# Patient Record
Sex: Female | Born: 1960 | Race: Black or African American | Hispanic: No | Smoking: Never smoker
Health system: Southern US, Community
[De-identification: ages and names within clinical notes are randomized; demographics above are authoritative.]

## PROBLEM LIST (undated history)

## (undated) DIAGNOSIS — M1990 Unspecified osteoarthritis, unspecified site: Secondary | ICD-10-CM

## (undated) DIAGNOSIS — I10 Essential (primary) hypertension: Secondary | ICD-10-CM

## (undated) DIAGNOSIS — R079 Chest pain, unspecified: Secondary | ICD-10-CM

## (undated) DIAGNOSIS — G43909 Migraine, unspecified, not intractable, without status migrainosus: Secondary | ICD-10-CM

## (undated) HISTORY — DX: Chest pain, unspecified: R07.9

## (undated) HISTORY — PX: KNEE SURGERY: SHX244

## (undated) HISTORY — PX: HAND TENDON SURGERY: SHX663

---

## 2002-02-02 ENCOUNTER — Emergency Department (HOSPITAL_COMMUNITY): Admission: EM | Admit: 2002-02-02 | Discharge: 2002-02-03 | Payer: Self-pay | Admitting: Emergency Medicine

## 2006-08-20 ENCOUNTER — Emergency Department (HOSPITAL_COMMUNITY): Admission: EM | Admit: 2006-08-20 | Discharge: 2006-08-20 | Payer: Self-pay | Admitting: Family Medicine

## 2006-09-08 ENCOUNTER — Emergency Department (HOSPITAL_COMMUNITY): Admission: EM | Admit: 2006-09-08 | Discharge: 2006-09-08 | Payer: Self-pay | Admitting: Emergency Medicine

## 2007-03-15 ENCOUNTER — Emergency Department (HOSPITAL_COMMUNITY): Admission: EM | Admit: 2007-03-15 | Discharge: 2007-03-15 | Payer: Self-pay | Admitting: Family Medicine

## 2008-08-17 ENCOUNTER — Emergency Department (HOSPITAL_BASED_OUTPATIENT_CLINIC_OR_DEPARTMENT_OTHER): Admission: EM | Admit: 2008-08-17 | Discharge: 2008-08-17 | Payer: Self-pay | Admitting: Emergency Medicine

## 2009-11-07 ENCOUNTER — Emergency Department (HOSPITAL_COMMUNITY): Admission: EM | Admit: 2009-11-07 | Discharge: 2009-11-07 | Payer: Self-pay | Admitting: Family Medicine

## 2012-07-13 ENCOUNTER — Encounter (HOSPITAL_BASED_OUTPATIENT_CLINIC_OR_DEPARTMENT_OTHER): Payer: Self-pay | Admitting: *Deleted

## 2012-07-13 ENCOUNTER — Emergency Department (HOSPITAL_BASED_OUTPATIENT_CLINIC_OR_DEPARTMENT_OTHER)

## 2012-07-13 ENCOUNTER — Emergency Department (HOSPITAL_BASED_OUTPATIENT_CLINIC_OR_DEPARTMENT_OTHER)
Admission: EM | Admit: 2012-07-13 | Discharge: 2012-07-13 | Disposition: A | Discharge status: Home / Self Care | Attending: Emergency Medicine | Admitting: Emergency Medicine

## 2012-07-13 DIAGNOSIS — Y93K1 Activity, walking an animal: Secondary | ICD-10-CM | POA: Insufficient documentation

## 2012-07-13 DIAGNOSIS — W19XXXA Unspecified fall, initial encounter: Secondary | ICD-10-CM | POA: Insufficient documentation

## 2012-07-13 DIAGNOSIS — S8990XA Unspecified injury of unspecified lower leg, initial encounter: Secondary | ICD-10-CM | POA: Insufficient documentation

## 2012-07-13 DIAGNOSIS — S99919A Unspecified injury of unspecified ankle, initial encounter: Secondary | ICD-10-CM | POA: Insufficient documentation

## 2012-07-13 DIAGNOSIS — I10 Essential (primary) hypertension: Secondary | ICD-10-CM | POA: Insufficient documentation

## 2012-07-13 DIAGNOSIS — Y998 Other external cause status: Secondary | ICD-10-CM | POA: Insufficient documentation

## 2012-07-13 DIAGNOSIS — M129 Arthropathy, unspecified: Secondary | ICD-10-CM | POA: Insufficient documentation

## 2012-07-13 DIAGNOSIS — S93609A Unspecified sprain of unspecified foot, initial encounter: Secondary | ICD-10-CM

## 2012-07-13 HISTORY — DX: Migraine, unspecified, not intractable, without status migrainosus: G43.909

## 2012-07-13 HISTORY — DX: Essential (primary) hypertension: I10

## 2012-07-13 HISTORY — DX: Unspecified osteoarthritis, unspecified site: M19.90

## 2012-07-13 MED ORDER — HYDROCODONE-ACETAMINOPHEN 5-325 MG PO TABS: 2.0000 | ORAL_TABLET | Freq: Four times a day (QID) | ORAL | Status: DC | PRN

## 2012-07-13 NOTE — ED Notes (Signed)
Tripped over her dog and fell a week ago. Pain to her foot since. Swelling noted.

## 2012-07-13 NOTE — ED Provider Notes (Signed)
History     CSN: [id number]  Arrival date & time 07/13/12  1939   First MD Initiated Contact with Patient 07/13/12 1947      Chief Complaint  Patient presents with  . Fall    (Consider location/radiation/quality/duration/timing/severity/associated sxs/prior treatment) HPI Pt reports about a week ago she fell while walking her dog injuring her R foot. Complaining of moderate to severe aching pain in R medial foot, worse with movement and walking. No ankle pain or knee pain.  Past Medical History  Diagnosis Date  . Hypertension   . Arthritis   . Migraine     Past Surgical History  Procedure Date  . Cesarean section     No family history on file.  History  Substance Use Topics  . Smoking status: Never Smoker   . Smokeless tobacco: Not on file  . Alcohol Use: No    OB History    Grav Para Term Preterm Abortions TAB SAB Ect Mult Living                  Review of Systems All other systems reviewed and are negative except as noted in HPI.   Allergies  Dhea; Imitrex; Stadol; Tegretol; and Topamax  Home Medications   Current Outpatient Rx  Name Route Sig Dispense Refill  . AZOR PO Oral Take by mouth.    Marland Kitchen FLEXERIL PO Oral Take by mouth.    Marland Kitchen HYDROCODONE-ACETAMINOPHEN PO Oral Take by mouth.    Marland Kitchen PHENERGAN PO Oral Take by mouth.      BP 141/83  Pulse 118  Temp 98.2 F (36.8 C) (Oral)  Resp 22  SpO2 99%  LMP 06/29/2012  Physical Exam  Constitutional: She is oriented to person, place, and time. She appears well-developed and well-nourished.  HENT:  Head: Normocephalic and atraumatic.  Neck: Neck supple.  Pulmonary/Chest: Effort normal.  Musculoskeletal: She exhibits edema and tenderness.       Swelling, ecchymosis and tenderness to dorsal and medial R foot, no malleolar tenderness or tenderness over the base of the 5th metatarsal  Neurological: She is alert and oriented to person, place, and time. No cranial nerve deficit.  Psychiatric: She has a normal  mood and affect. Her behavior is normal.    ED Course  Procedures (including critical care time)  Labs Reviewed - No data to display Dg Foot Complete Right  07/13/2012  *RADIOLOGY REPORT*  Clinical Data: Right medial foot pain  RIGHT FOOT COMPLETE - 3+ VIEW  Comparison: None.  Findings: No evidence of fracture of the tarsal or metatarsal.  The phalanges are normal.  Joint spaces are fairly well maintained. The calcaneus demonstrates mild plantar spurring.  No soft tissue abnormality.  IMPRESSION: No acute osseous abnormality.   Original Report Authenticated By: Genevive Bi, M.D.      No diagnosis found.    MDM  Xray as above neg for fracture. Post-op shoe, crutches, pain meds and PCP followup.         Tyden Kann B. Bernette Mayers, MD 07/13/12 2033

## 2014-01-06 ENCOUNTER — Encounter (HOSPITAL_COMMUNITY): Payer: Self-pay | Admitting: Emergency Medicine

## 2014-01-06 ENCOUNTER — Emergency Department (HOSPITAL_COMMUNITY)
Admission: EM | Admit: 2014-01-06 | Discharge: 2014-01-06 | Disposition: A | Discharge status: Home / Self Care | Attending: Emergency Medicine | Admitting: Emergency Medicine

## 2014-01-06 ENCOUNTER — Emergency Department (INDEPENDENT_AMBULATORY_CARE_PROVIDER_SITE_OTHER)
Admission: EM | Admit: 2014-01-06 | Discharge: 2014-01-06 | Disposition: A | Discharge status: Home / Self Care | Source: Home / Self Care | Attending: Emergency Medicine | Admitting: Emergency Medicine

## 2014-01-06 ENCOUNTER — Emergency Department (HOSPITAL_COMMUNITY)

## 2014-01-06 DIAGNOSIS — G43909 Migraine, unspecified, not intractable, without status migrainosus: Secondary | ICD-10-CM | POA: Insufficient documentation

## 2014-01-06 DIAGNOSIS — R51 Headache: Secondary | ICD-10-CM | POA: Insufficient documentation

## 2014-01-06 DIAGNOSIS — R079 Chest pain, unspecified: Secondary | ICD-10-CM | POA: Insufficient documentation

## 2014-01-06 DIAGNOSIS — I10 Essential (primary) hypertension: Secondary | ICD-10-CM | POA: Insufficient documentation

## 2014-01-06 DIAGNOSIS — M129 Arthropathy, unspecified: Secondary | ICD-10-CM | POA: Insufficient documentation

## 2014-01-06 DIAGNOSIS — Z79899 Other long term (current) drug therapy: Secondary | ICD-10-CM | POA: Insufficient documentation

## 2014-01-06 LAB — COMPREHENSIVE METABOLIC PANEL
ALBUMIN: 4.1 g/dL (ref 3.5–5.2)
ALT: 14 U/L (ref 0–35)
AST: 15 U/L (ref 0–37)
Alkaline Phosphatase: 52 U/L (ref 39–117)
BUN: 14 mg/dL (ref 6–23)
CALCIUM: 9.3 mg/dL (ref 8.4–10.5)
CO2: 26 mEq/L (ref 19–32)
CREATININE: 0.77 mg/dL (ref 0.50–1.10)
Chloride: 100 mEq/L (ref 96–112)
GFR calc Af Amer: >90 mL/min (ref >90)
GFR calc non Af Amer: >90 mL/min (ref >90)
Glucose, Bld: 90 mg/dL (ref 70–99)
Potassium: 3.6 mEq/L — ABNORMAL LOW (ref 3.7–5.3)
Sodium: 140 mEq/L (ref 137–147)
Total Bilirubin: 0.2 mg/dL — ABNORMAL LOW (ref 0.3–1.2)
Total Protein: 7.4 g/dL (ref 6.0–8.3)

## 2014-01-06 LAB — CBC WITH DIFFERENTIAL/PLATELET
BASOS ABS: 0 10*3/uL (ref 0.0–0.1)
BASOS PCT: 0 % (ref 0–1)
EOS ABS: 0.1 10*3/uL (ref 0.0–0.7)
EOS PCT: 1 % (ref 0–5)
HEMATOCRIT: 38.1 % (ref 36.0–46.0)
Hemoglobin: 12.2 g/dL (ref 12.0–15.0)
Lymphocytes Relative: 26 % (ref 12–46)
Lymphs Abs: 2 10*3/uL (ref 0.7–4.0)
MCH: 27.9 pg (ref 26.0–34.0)
MCHC: 32 g/dL (ref 30.0–36.0)
MCV: 87 fL (ref 78.0–100.0)
MONO ABS: 0.5 10*3/uL (ref 0.1–1.0)
MONOS PCT: 6 % (ref 3–12)
NEUTROS ABS: 5.2 10*3/uL (ref 1.7–7.7)
Neutrophils Relative %: 67 % (ref 43–77)
Platelets: 287 10*3/uL (ref 150–400)
RBC: 4.38 MIL/uL (ref 3.87–5.11)
RDW: 15.1 % (ref 11.5–15.5)
WBC: 7.7 10*3/uL (ref 4.0–10.5)

## 2014-01-06 LAB — TROPONIN I

## 2014-01-06 LAB — LIPASE, BLOOD: LIPASE: 34 U/L (ref 11–59)

## 2014-01-06 MED ORDER — NITROGLYCERIN 0.4 MG SL SUBL
0.4000 mg | SUBLINGUAL_TABLET | SUBLINGUAL | Status: AC | PRN
  Administered 2014-01-06: 0.4 mg via SUBLINGUAL

## 2014-01-06 MED ORDER — ASPIRIN 81 MG PO CHEW
CHEWABLE_TABLET | ORAL | Status: AC
  Filled 2014-01-06: qty 4

## 2014-01-06 MED ORDER — SODIUM CHLORIDE 0.9 % IV SOLN
INTRAVENOUS | Status: DC
  Administered 2014-01-06: 20:00:00 via INTRAVENOUS

## 2014-01-06 MED ORDER — SODIUM CHLORIDE 0.9 % IV SOLN
INTRAVENOUS | Status: DC
  Administered 2014-01-06: 19:00:00 via INTRAVENOUS

## 2014-01-06 MED ORDER — NITROGLYCERIN 0.4 MG SL SUBL
SUBLINGUAL_TABLET | SUBLINGUAL | Status: AC
  Filled 2014-01-06: qty 1

## 2014-01-06 MED ORDER — ASPIRIN 81 MG PO CHEW
324.0000 mg | CHEWABLE_TABLET | Freq: Once | ORAL | Status: AC
  Administered 2014-01-06: 324 mg via ORAL

## 2014-01-06 NOTE — Discharge Instructions (Signed)
We have determined that your problem requires further evaluation in the emergency department.  We will take care of your transport there.  Once at the emergency department, you will be evaluated by a provider and they will order whatever treatment or tests they deem necessary.  We cannot guarantee that they will do any specific test or do any specific treatment.  ° °

## 2014-01-06 NOTE — ED Notes (Signed)
[facility] c/o intermittent central CP radiating to back and occasionally foot x3 weeks. [facility] pain 7/10. Pt HAS HAD 324 of [facility]SA [facility] 5/10 and 1 nitro with [facility]- pt denies CP, endorses headache. Denies SOB, nausea, dizziness, lightheadedness and diaphoresis. Pt in NAD

## 2014-01-06 NOTE — ED Provider Notes (Signed)
CSN: [id number]     Arrival date & time 01/06/14  1919 History   First MD Initiated Contact with Patient 01/06/14 1941     Chief Complaint  Patient presents with  . Chest Pain    pain subsided after taking nitroglycerin  . Headache    after taking nitrogylcerin       (Consider location/radiation/quality/duration/timing/severity/associated sxs/prior Treatment) Patient is a 53 y.o. female presenting with chest pain and headaches. The history is provided by the patient.  Chest Pain Associated symptoms: headache   Headache  patient here complaining of right-sided chest pain x3 weeks lasting anywhere from 5-10 minutes. History of rate stress test 2 years ago which according to the patient was negative. She has had increasing symptoms to the point where she has been seen by a physician for this twice. Was seen 3 weeks ago and was diagnosed with possible muscle strain and treated with cortisone and Phenergan. Today she went to a different urgent care because of continuing pain and was given nitroglycerin had complete relief of her symptoms. She did not have any pleuritic component to this. No dyspnea, diaphoresis. Denies any leg pain or swelling. She was also given aspirin prior to arrival. She does have a very strong family history of premature cardiac disease. She is currently pain-free  Past Medical History  Diagnosis Date  . Hypertension   . Arthritis   . Migraine    Past Surgical History  Procedure Laterality Date  . Cesarean section  1994   Family History  Problem Relation Age of Onset  . Hypertension Mother   . Heart Problems Mother   . Diabetes Father   . Kidney failure Father    History  Substance Use Topics  . Smoking status: Never Smoker   . Smokeless tobacco: Not on file  . Alcohol Use: No   OB History   Grav Para Term Preterm Abortions TAB SAB Ect Mult Living                 Review of Systems  Cardiovascular: Positive for chest pain.  Neurological: Positive for  headaches.  All other systems reviewed and are negative.      Allergies  Dhea; Imitrex; Stadol; Tegretol; and Topamax  Home Medications   Current Outpatient Rx  Name  Route  Sig  Dispense  Refill  . Amlodipine-Olmesartan (AZOR PO)   Oral   Take by mouth.         . Cyclobenzaprine HCl (FLEXERIL PO)   Oral   Take by mouth.         Marland Kitchen. HYDROcodone-acetaminophen (NORCO/VICODIN) 5-325 MG per tablet   Oral   Take 2 tablets by mouth every 6 (six) hours as needed for pain.   30 tablet   0   . HYDROCODONE-ACETAMINOPHEN PO   Oral   Take by mouth.         . Promethazine HCl (PHENERGAN PO)   Oral   Take by mouth.          BP 121/74  Pulse 88  Temp(Src) 97.9 F (36.6 C) (Oral)  Resp 14  Ht 5\' 7"  (1.702 m)  Wt 202 lb (91.627 kg)  BMI 31.63 kg/m2  SpO2 96%  LMP 11/24/2013 Physical Exam  Nursing note and vitals reviewed. Constitutional: She is oriented to person, place, and time. She appears well-developed and well-nourished.  Non-toxic appearance. No distress.  HENT:  Head: Normocephalic and atraumatic.  Eyes: Conjunctivae, EOM and lids are normal. Pupils are  equal, round, and reactive to light.  Neck: Normal range of motion. Neck supple. No tracheal deviation present. No mass present.  Cardiovascular: Normal rate, regular rhythm and normal heart sounds.  Exam reveals no gallop.   No murmur heard. Pulmonary/Chest: Effort normal and breath sounds normal. No stridor. No respiratory distress. She has no decreased breath sounds. She has no wheezes. She has no rhonchi. She has no rales.  Abdominal: Soft. Normal appearance and bowel sounds are normal. She exhibits no distension. There is no tenderness. There is no rebound and no CVA tenderness.  Musculoskeletal: Normal range of motion. She exhibits no edema and no tenderness.  Neurological: She is alert and oriented to person, place, and time. She has normal strength. No cranial nerve deficit or sensory deficit. GCS eye  subscore is 4. GCS verbal subscore is 5. GCS motor subscore is 6.  Skin: Skin is warm and dry. No abrasion and no rash noted.  Psychiatric: She has a normal mood and affect. Her speech is normal and behavior is normal.    ED Course  Procedures (including critical care time) Labs Review Labs Reviewed  CBC WITH DIFFERENTIAL  COMPREHENSIVE METABOLIC PANEL  TROPONIN I  LIPASE, BLOOD   Imaging Review No results found.   EKG Interpretation   Date/Time:  Monday January 06 2014 19:23:54 EDT Ventricular Rate:  94 PR Interval:  137 QRS Duration: 91 QT Interval:  355 QTC Calculation: 444 R Axis:   -8 Text Interpretation:  Sinus rhythm Low voltage, precordial leads Abnormal  R-wave progression, early transition Borderline T wave abnormalities No  significant change since last tracing Confirmed by Hyun Reali  MD, [place] Talcott  ([id number]) on 01/06/2014 8:23:39 PM      MDM   Final diagnoses:  None   Patient offered admission for evaluation of her chest pain. She has deferred admission at this time. Risk of sudden cardiac death explained to patient and she understands this. Patient encouraged strongly to followup with her cardiologist as soon as possible and return here if she should change her mind. This conversation was in front of her husband     Toy Baker, MD 01/06/14 2147

## 2014-01-06 NOTE — Discharge Instructions (Signed)
You were offered admission for evaluation of your chest pain which you have deferred at this time. The risk of sudden cardiac death has been explained to and you except this. Follow up with your cardiologist or the one that you have been referred to. Return here if he should change her mind or for worsening symptoms Chest Pain (Nonspecific) It is often hard to give a specific diagnosis for the cause of chest pain. There is always a chance that your pain could be related to something serious, such as a heart attack or a blood clot in the lungs. You need to follow up with your caregiver for further evaluation. CAUSES   Heartburn.  Pneumonia or bronchitis.  Anxiety or stress.  Inflammation around your heart (pericarditis) or lung (pleuritis or pleurisy).  A blood clot in the lung.  A collapsed lung (pneumothorax). It can develop suddenly on its own (spontaneous pneumothorax) or from injury (trauma) to the chest.  Shingles infection (herpes zoster virus). The chest wall is composed of bones, muscles, and cartilage. Any of these can be the source of the pain.  The bones can be bruised by injury.  The muscles or cartilage can be strained by coughing or overwork.  The cartilage can be affected by inflammation and become sore (costochondritis). DIAGNOSIS  Lab tests or other studies, such as X-rays, electrocardiography, stress testing, or cardiac imaging, may be needed to find the cause of your pain.  TREATMENT   Treatment depends on what may be causing your chest pain. Treatment may include:  Acid blockers for heartburn.  Anti-inflammatory medicine.  Pain medicine for inflammatory conditions.  Antibiotics if an infection is present.  You may be advised to change lifestyle habits. This includes stopping smoking and avoiding alcohol, caffeine, and chocolate.  You may be advised to keep your head raised (elevated) when sleeping. This reduces the chance of acid going backward from your  stomach into your esophagus.  Most of the time, nonspecific chest pain will improve within 2 to 3 days with rest and mild pain medicine. HOME CARE INSTRUCTIONS   If antibiotics were prescribed, take your antibiotics as directed. Finish them even if you start to feel better.  For the next few days, avoid physical activities that bring on chest pain. Continue physical activities as directed.  Do not smoke.  Avoid drinking alcohol.  Only take over-the-counter or prescription medicine for pain, discomfort, or fever as directed by your caregiver.  Follow your caregiver's suggestions for further testing if your chest pain does not go away.  Keep any follow-up appointments you made. If you do not go to an appointment, you could develop lasting (chronic) problems with pain. If there is any problem keeping an appointment, you must call to reschedule. SEEK MEDICAL CARE IF:   You think you are having problems from the medicine you are taking. Read your medicine instructions carefully.  Your chest pain does not go away, even after treatment.  You develop a rash with blisters on your chest. SEEK IMMEDIATE MEDICAL CARE IF:   You have increased chest pain or pain that spreads to your arm, neck, jaw, back, or abdomen.  You develop shortness of breath, an increasing cough, or you are coughing up blood.  You have severe back or abdominal pain, feel nauseous, or vomit.  You develop severe weakness, fainting, or chills.  You have a fever. THIS IS AN EMERGENCY. Do not wait to see if the pain will go away. Get medical help at once.  Call your local emergency services (911 in [place] Do not drive yourself to the hospital. MAKE SURE YOU:   Understand these instructions.  Will watch your condition.  Will get help right away if you are not doing well or get worse. Document Released: 07/13/2005 Document Revised: 12/26/2011 Document Reviewed: 05/08/2008 [facility] Patient Information 2014 [place],  [place].

## 2014-01-06 NOTE — ED Notes (Signed)
[facility] here 

## 2014-01-06 NOTE — ED Notes (Signed)
Report given to Charge RN Efraim KaufmannMelissa and IT trainerHazel RN for [facility].

## 2014-01-06 NOTE — ED Notes (Signed)
Pt informed she is always welcome her for same or worsening symptoms or any other concerns.

## 2014-01-06 NOTE — ED Provider Notes (Signed)
Chief Complaint   Chief Complaint  Patient presents with  . Chest Pain    History of Present Illness    Amanda Hansen is a 53 year old female with high blood pressure who presents with a three-week history of intermittent left pectoral chest pain which radiates to the sternal area, the right upper quadrant of the abdomen, and around to the back. The pain comes and goes lasting minutes to hours and may occur up to every other day. The pain is nonexertional and is nothing that makes it better or worse. It is nonpleuritic. Sometimes it wakes her up from sleep. The pain feels like a stabbing like a hunger pain. It's been associated with palpitations and forceful heartbeat, shortness of breath, dizziness, and nausea. The patient has had pain since 5 AM today rated 8/10 in intensity. She was seen for the same pain by her primary care physician 2 weeks ago. No x-rays or EKGs were done. She was given steroids, but these have not helped any. She has a history of high blood pressure and is on a sore 40 mg a day, and there is also a positive family history of early heart disease. She has no history of elevated cholesterol, diabetes, or cigarette smoking.  Review of Systems    Other than noted above, the patient denies any of the following symptoms. Systemic:  No fever or chills. Pulmonary:  No cough, wheezing, shortness of breath, sputum production, hemoptysis. Cardiac:  No palpitations, rapid heartbeat, dizziness, presyncope or syncope. GI:  No abdominal pain, heartburn, nausea, or vomiting. Ext:  No leg pain or swelling.  PMFSH    Past medical history, family history, social history, meds, and allergies were reviewed. She also has migraine headaches. She's allergic to a number of migraine medications including Imitrex, Stadol, Tegretol, Topamax, and DHEA.  Physical Exam     Vital signs:  BP 141/87  Pulse 101  Temp(Src) 97.5 F (36.4 C) (Oral)  Resp 16  SpO2 98%  LMP 11/24/2013 Gen:  Alert,  oriented, in no distress, skin warm and dry. Eye:  PERRL, lids and conjunctivas normal.  Sclera non-icteric. ENT:  Mucous membranes moist, pharynx clear. Neck:  Supple, no adenopathy or tenderness.  No JVD. Lungs:  Clear to auscultation, no wheezes, rales or rhonchi.  No respiratory distress. Heart:  Regular rhythm.  No gallops, murmers, clicks or rubs. Chest:  No chest wall tenderness. Abdomen:  Soft, nontender, no organomegaly or mass.  Bowel sounds normal.  No pulsatile abdominal mass or bruit. Ext:  No edema.  No calf tenderness and Homann's sign negative.  Pulses full and equal. Skin:  Warm and dry.  No rash.  Electrocardiogram     Date: 01/06/2014  Rate: 90  Rhythm: normal sinus rhythm  QRS Axis: normal  Intervals: normal  ST/T Wave abnormalities: normal  Conduction Disutrbances:none  Narrative Interpretation: Normal sinus rhythm, normal EKG.  Old EKG Reviewed: none available  Course in Urgent Care Center         Be on normal saline IV at 50 mL per hour, oxygen, monitor, and given aspirin 325 mg by mouth and nitroglycerin 0.4 mg sublingually.  Assessment     The encounter diagnosis was Chest pain.  Differential diagnosis includes ischemic heart disease, pulmonary embolism, musculoskeletal pain, gastroesophageal reflux, or gallbladder disease.  Plan     The patient was transferred to the ED via [facility] in stable condition.  Medical Decision Making   53 year old female has a 3 week history of intermittent right pectoral, substernal, and RUQ pain lasting minutes to hours, associated with shortness of breath, nausea, dizziness, and palpitations.  No prior cardiac disease.  She does have high blood pressure and a positive family history.  Her EKG was normal, but her story is concerning for ischemic heart disease.  We have started  NS, O2, TNG and ASA and will transfer via [facility].        Reuben Likes, MD 01/06/14 [phone]

## 2014-01-06 NOTE — ED Notes (Signed)
C/o sharp stabbing R ant. upper chest pain and epigastric pain onset 3 weeks ago.  [facility] and phenergan with a little relief.  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  She does heavy lifting.  No SOB, or sweating.  States she has nausea once or twice and took the Phenergan with relief.  States cramping in her L leg to her foot while she was having the pain.  The pain in epigastric area radiates around to post. Chest.  Denies pain on palpation to R upper chest.

## 2014-01-06 NOTE — ED Notes (Signed)
[facility] called to get pt.

## 2014-01-06 NOTE — ED Notes (Signed)
Monitor applied per EMT.  NSR.

## 2014-01-08 ENCOUNTER — Ambulatory Visit: Admitting: Cardiovascular Disease

## 2014-01-22 ENCOUNTER — Encounter: Payer: Self-pay | Admitting: Cardiovascular Disease

## 2014-01-22 ENCOUNTER — Encounter: Payer: Self-pay | Admitting: *Deleted

## 2014-01-22 DIAGNOSIS — M1990 Unspecified osteoarthritis, unspecified site: Secondary | ICD-10-CM | POA: Insufficient documentation

## 2014-01-22 DIAGNOSIS — G43909 Migraine, unspecified, not intractable, without status migrainosus: Secondary | ICD-10-CM | POA: Insufficient documentation

## 2014-01-22 DIAGNOSIS — I10 Essential (primary) hypertension: Secondary | ICD-10-CM | POA: Insufficient documentation

## 2014-01-22 DIAGNOSIS — R079 Chest pain, unspecified: Secondary | ICD-10-CM | POA: Insufficient documentation

## 2014-01-24 ENCOUNTER — Encounter: Payer: Self-pay | Admitting: Cardiovascular Disease

## 2014-01-24 ENCOUNTER — Ambulatory Visit (INDEPENDENT_AMBULATORY_CARE_PROVIDER_SITE_OTHER): Admitting: Cardiovascular Disease

## 2014-01-24 VITALS — BP 140/96 | HR 94 | Ht 67.0 in | Wt 199.8 lb

## 2014-01-24 DIAGNOSIS — R079 Chest pain, unspecified: Secondary | ICD-10-CM

## 2014-01-24 DIAGNOSIS — I10 Essential (primary) hypertension: Secondary | ICD-10-CM

## 2014-01-24 NOTE — Assessment & Plan Note (Signed)
Well controlled.  Continue current medications and low sodium Dash type diet.    

## 2014-01-24 NOTE — Patient Instructions (Addendum)
Your physician recommends that you schedule a follow-up appointment in: AS NEEDED Your physician has requested that you have a stress echocardiogram. For further information please visit [email or web address]. Please follow instruction sheet as given. Your physician recommends that you continue on your current medications as directed. Please refer to the Current Medication list given to you today.

## 2014-01-24 NOTE — Assessment & Plan Note (Signed)
Atypical Likely muscular  Nonspecific ECG changes  F/U stress echo

## 2014-01-24 NOTE — Progress Notes (Signed)
Patient ID: Amanda Hansen, female   DOB: 07/03/1961, 53 y.o.   MRN: [id number]  53 yo referred by ER complaining of right-sided chest pain x3 weeks lasting anywhere from 5-10 minutes. Seen in ER 3/18  History of rate stress test 2 years ago which according to the patient was negative. She has had increasing symptoms to the point where she has been seen by a physician for this twice. Was seen 3 weeks ago and was diagnosed with possible muscle strain and treated with cortisone and Phenergan. Today she went to a different urgent care because of continuing pain and was given nitroglycerin had complete relief of her symptoms. She did not have any pleuritic component to this. No dyspnea, diaphoresis. Denies any leg pain or swelling. She was also given aspirin prior to arrival. She does have a very strong family history of premature cardiac disease. She is currently pain-free  Associated symptom of headache  CRF HTN on Rx   R/O CXR normal  ECG no acute changes     ROS: Denies fever, malais, weight loss, blurry vision, decreased visual acuity, cough, sputum, SOB, hemoptysis, pleuritic pain, palpitaitons, heartburn, abdominal pain, melena, lower extremity edema, claudication, or rash.  All other systems reviewed and negative   General: Affect appropriate Healthy:  appears stated age HEENT: normal Neck supple with no adenopathy JVP normal no bruits no thyromegaly Lungs clear with no wheezing and good diaphragmatic motion Heart:  S1/S2 no murmur,rub, gallop or click PMI normal Abdomen: benighn, BS positve, no tenderness, no AAA no bruit.  No HSM or HJR Distal pulses intact with no bruits No edema Neuro non-focal Skin warm and dry No muscular weakness  Medications Current Outpatient Prescriptions  Medication Sig Dispense Refill  . amLODipine-olmesartan (AZOR) 10-40 MG per tablet Take 1 tablet by mouth daily.      Marland Kitchen. aspirin-acetaminophen-caffeine (EXCEDRIN MIGRAINE) 250-250-65 MG per tablet Take 2  tablets by mouth every 6 (six) hours as needed for migraine.      Marland Kitchen. HYDROcodone-acetaminophen (NORCO/VICODIN) 5-325 MG per tablet Take 1 tablet by mouth every 6 (six) hours as needed for moderate pain.      . meloxicam (MOBIC) 15 MG tablet Take 15 mg by mouth daily.      . methocarbamol (ROBAXIN) 500 MG tablet Take 500 mg by mouth 4 (four) times daily.      . promethazine (PHENERGAN) 25 MG tablet Take 25 mg by mouth every 6 (six) hours as needed for nausea or vomiting.       No current facility-administered medications for this visit.    Allergies Dhea; Imitrex; Stadol; Tegretol; and Topamax  Family History: Family History  Problem Relation Age of Onset  . Hypertension Mother   . Heart Problems Mother   . Diabetes Father   . Kidney failure Father     Social History: History   Social History  . Marital Status: Widowed    Spouse Name: N/A    Number of Children: N/A  . Years of Education: N/A   Occupational History  . Not on file.   Social History Main Topics  . Smoking status: Never Smoker   . Smokeless tobacco: Not on file  . Alcohol Use: No  . Drug Use: No  . Sexual Activity: Not on file   Other Topics Concern  . Not on file   Social History Narrative  . No narrative on file    Electrocardiogram:  SR rate 94 nonspecific inferior ST segment changes   Assessment  and Plan

## 2014-02-07 ENCOUNTER — Ambulatory Visit (HOSPITAL_COMMUNITY): Attending: Cardiology | Admitting: Radiology

## 2014-02-07 ENCOUNTER — Encounter: Payer: Self-pay | Admitting: Internal Medicine

## 2014-02-07 DIAGNOSIS — R072 Precordial pain: Secondary | ICD-10-CM

## 2014-02-07 DIAGNOSIS — R079 Chest pain, unspecified: Secondary | ICD-10-CM

## 2014-02-07 NOTE — Progress Notes (Signed)
Echocardiogram performed.  

## 2014-02-12 ENCOUNTER — Telehealth: Payer: Self-pay | Admitting: Cardiovascular Disease

## 2014-02-12 NOTE — Telephone Encounter (Signed)
New message  ° ° °Patient calling back to speak with nurse  °

## 2014-02-12 NOTE — Telephone Encounter (Signed)
PT  AWARE OF STRESS ECHO RESULTS ./CY 

## 2016-01-20 ENCOUNTER — Emergency Department (HOSPITAL_BASED_OUTPATIENT_CLINIC_OR_DEPARTMENT_OTHER)
Admission: EM | Admit: 2016-01-20 | Discharge: 2016-01-21 | Disposition: A | Discharge status: Home / Self Care | Attending: Emergency Medicine | Admitting: Emergency Medicine

## 2016-01-20 ENCOUNTER — Encounter (HOSPITAL_BASED_OUTPATIENT_CLINIC_OR_DEPARTMENT_OTHER): Payer: Self-pay

## 2016-01-20 DIAGNOSIS — Z791 Long term (current) use of non-steroidal anti-inflammatories (NSAID): Secondary | ICD-10-CM | POA: Insufficient documentation

## 2016-01-20 DIAGNOSIS — G43909 Migraine, unspecified, not intractable, without status migrainosus: Secondary | ICD-10-CM | POA: Diagnosis not present

## 2016-01-20 DIAGNOSIS — R1010 Upper abdominal pain, unspecified: Secondary | ICD-10-CM

## 2016-01-20 DIAGNOSIS — K921 Melena: Secondary | ICD-10-CM | POA: Diagnosis not present

## 2016-01-20 DIAGNOSIS — Z79899 Other long term (current) drug therapy: Secondary | ICD-10-CM | POA: Diagnosis not present

## 2016-01-20 DIAGNOSIS — M1990 Unspecified osteoarthritis, unspecified site: Secondary | ICD-10-CM | POA: Insufficient documentation

## 2016-01-20 DIAGNOSIS — I10 Essential (primary) hypertension: Secondary | ICD-10-CM | POA: Diagnosis not present

## 2016-01-20 DIAGNOSIS — K922 Gastrointestinal hemorrhage, unspecified: Secondary | ICD-10-CM | POA: Diagnosis not present

## 2016-01-20 DIAGNOSIS — D649 Anemia, unspecified: Secondary | ICD-10-CM | POA: Insufficient documentation

## 2016-01-20 LAB — COMPREHENSIVE METABOLIC PANEL
ALT: 15 U/L (ref 14–54)
AST: 17 U/L (ref 15–41)
Albumin: 4.2 g/dL (ref 3.5–5.0)
Alkaline Phosphatase: 44 U/L (ref 38–126)
Anion gap: 8 (ref 5–15)
BUN: 25 mg/dL — ABNORMAL HIGH (ref 6–20)
CO2: 25 mmol/L (ref 22–32)
Calcium: 9.5 mg/dL (ref 8.9–10.3)
Chloride: 106 mmol/L (ref 101–111)
Creatinine, Ser: 0.77 mg/dL (ref 0.44–1.00)
GFR calc Af Amer: >60 mL/min (ref >60)
GFR calc non Af Amer: >60 mL/min (ref >60)
Glucose, Bld: 96 mg/dL (ref 65–99)
Potassium: 4 mmol/L (ref 3.5–5.1)
Sodium: 139 mmol/L (ref 135–145)
Total Bilirubin: 0.3 mg/dL (ref 0.3–1.2)
Total Protein: 6.9 g/dL (ref 6.5–8.1)

## 2016-01-20 LAB — CBC WITH DIFFERENTIAL/PLATELET
Basophils Absolute: 0 10*3/uL (ref 0.0–0.1)
Basophils Relative: 0 %
Eosinophils Absolute: 0.2 10*3/uL (ref 0.0–0.7)
Eosinophils Relative: 2 %
HCT: 31.8 % — ABNORMAL LOW (ref 36.0–46.0)
Hemoglobin: 10.3 g/dL — ABNORMAL LOW (ref 12.0–15.0)
Lymphocytes Relative: 34 %
Lymphs Abs: 2.9 10*3/uL (ref 0.7–4.0)
MCH: 27.8 pg (ref 26.0–34.0)
MCHC: 32.4 g/dL (ref 30.0–36.0)
MCV: 85.7 fL (ref 78.0–100.0)
Monocytes Absolute: 0.7 10*3/uL (ref 0.1–1.0)
Monocytes Relative: 8 %
Neutro Abs: 4.9 10*3/uL (ref 1.7–7.7)
Neutrophils Relative %: 56 %
Platelets: 319 10*3/uL (ref 150–400)
RBC: 3.71 MIL/uL — ABNORMAL LOW (ref 3.87–5.11)
RDW: 14.6 % (ref 11.5–15.5)
WBC: 8.7 10*3/uL (ref 4.0–10.5)

## 2016-01-20 LAB — URINE MICROSCOPIC-ADD ON

## 2016-01-20 LAB — URINALYSIS, ROUTINE W REFLEX MICROSCOPIC
Bilirubin Urine: NEGATIVE
Glucose, UA: NEGATIVE mg/dL
Ketones, ur: NEGATIVE mg/dL
Leukocytes, UA: NEGATIVE
NITRITE: NEGATIVE
PH: 5.5 (ref 5.0–8.0)
Protein, ur: NEGATIVE mg/dL
SPECIFIC GRAVITY, URINE: 1.011 (ref 1.005–1.030)

## 2016-01-20 LAB — OCCULT BLOOD X 1 CARD TO LAB, STOOL: Fecal Occult Bld: POSITIVE — AB

## 2016-01-20 LAB — LIPASE, BLOOD: Lipase: 24 U/L (ref 11–51)

## 2016-01-20 MED ORDER — OMEPRAZOLE 20 MG PO CPDR: 20.0000 mg | DELAYED_RELEASE_CAPSULE | Freq: Every day | ORAL | Status: AC

## 2016-01-20 NOTE — ED Provider Notes (Signed)
CSN: [id number]     Arrival date & time 01/20/16  1914 History   First MD Initiated Contact with Patient 01/20/16 2125     Chief Complaint  Patient presents with  . Abdominal Pain     (Consider location/radiation/quality/duration/timing/severity/associated sxs/prior Treatment) HPI Comments: Patient is a 55 year old female who presents with abdominal pain and dark, tarry, loose stools. The patient has been experiencing upper abdominal pain for the past few months but it has gotten worse over the past few days. The patient has had multiple dark, tarry stools since yesterday. Her bowel movements were normal up until this time. She describes them as black, and then dark green on the tissue after wiping. Patient has been taking Zantac which resolves the pain for a time. Pain is worse at night and early morning. She describes the pain as sharp, intermittent, and has noticed it comes with stress. She will also experience the pain worse after eating, especially when she has had an empty stomach prior. The pain is also worse when she lays down. The patient reports she suffers from migraines that she gets around 3 times per month. When she has them, she takes 3-4 Excedrin every 6 hours for the pain. She also will take Norco occasionally as needed. Patient does report increased stress in her life. She has a prescription for Lexapro that she has not started taking it. Patient denies any diet change or iron supplementation. Patient denies any chest pain, shortness of breath, nausea, vomiting, dysuria. She has seen PCP for the abdominal pain who told her to continue Zantac as needed and follow-up with GI for further evaluation and colonoscopy. Patient has not had a colonoscopy or followed up to the GI specialist by her PCP recommended.   Patient is a 55 y.o. female presenting with abdominal pain. The history is provided by the patient.  Abdominal Pain Associated symptoms: no chest pain, no chills, no dysuria, no  fever, no nausea, no shortness of breath, no sore throat and no vomiting     Past Medical History  Diagnosis Date  . Hypertension   . Arthritis   . Migraine   . Chest pain    Past Surgical History  Procedure Laterality Date  . Cesarean section  1994   Family History  Problem Relation Age of Onset  . Hypertension Mother   . Heart Problems Mother   . Diabetes Father   . Kidney failure Father    Social History  Substance Use Topics  . Smoking status: Never Smoker   . Smokeless tobacco: None  . Alcohol Use: No   OB History    No data available     Review of Systems  Constitutional: Negative for fever and chills.  HENT: Negative for facial swelling and sore throat.   Respiratory: Negative for shortness of breath.   Cardiovascular: Negative for chest pain.  Gastrointestinal: Positive for abdominal pain and blood in stool. Negative for nausea and vomiting.  Genitourinary: Negative for dysuria.  Musculoskeletal: Negative for back pain.  Skin: Negative for rash and wound.  Neurological: Negative for headaches.  Psychiatric/Behavioral: The patient is not nervous/anxious.       Allergies  Dhea; Imitrex; Stadol; Tegretol; and Topamax  Home Medications   Prior to Admission medications   Medication Sig Start Date End Date Taking? Authorizing Provider  amLODipine-olmesartan (AZOR) 10-40 MG per tablet Take 1 tablet by mouth daily.   Yes Historical Provider, MD  aspirin-acetaminophen-caffeine (EXCEDRIN MIGRAINE) [id number] MG per tablet Take 2  tablets by mouth every 6 (six) hours as needed for migraine.   Yes Historical Provider, MD  HYDROcodone-acetaminophen (NORCO/VICODIN) 5-325 MG per tablet Take 1 tablet by mouth every 6 (six) hours as needed for moderate pain.   Yes Historical Provider, MD  meloxicam (MOBIC) 15 MG tablet Take 15 mg by mouth daily.   Yes Historical Provider, MD  methocarbamol (ROBAXIN) 500 MG tablet Take 500 mg by mouth 4 (four) times daily.   Yes  Historical Provider, MD  promethazine (PHENERGAN) 25 MG tablet Take 25 mg by mouth every 6 (six) hours as needed for nausea or vomiting.   Yes Historical Provider, MD  omeprazole (PRILOSEC) 20 MG capsule Take 1 capsule (20 mg total) by mouth daily. 01/20/16   Estephanie Hubbs M Analisa Sledd, PA-C   BP 131/78 mmHg  Pulse 97  Temp(Src) 98.2 F (36.8 C) (Oral)  Resp 18  Ht 5\' 7"  (1.702 m)  Wt 90.266 kg  BMI 31.16 kg/m2  SpO2 100% Physical Exam  Constitutional: She appears well-developed and well-nourished. No distress.  HENT:  Head: Normocephalic and atraumatic.  Eyes: Conjunctivae are normal. Pupils are equal, round, and reactive to light. Right eye exhibits no discharge. Left eye exhibits no discharge. No scleral icterus.  Neck: Normal range of motion. Neck supple. No thyromegaly present.  Cardiovascular: Normal rate, regular rhythm and normal heart sounds.  Exam reveals no gallop and no friction rub.   No murmur heard. Pulmonary/Chest: Effort normal and breath sounds normal. No stridor. No respiratory distress. She has no wheezes. She has no rales.  Abdominal: Soft. Bowel sounds are normal. She exhibits no distension, no pulsatile midline mass and no mass. There is no hepatosplenomegaly. There is tenderness. There is no rebound, no guarding, no tenderness at McBurney's point and negative Murphy's sign.    Genitourinary: Rectal exam shows no external hemorrhoid, no internal hemorrhoid, no tenderness and anal tone normal. Guaiac positive stool.  Musculoskeletal: She exhibits no edema.  Lymphadenopathy:    She has no cervical adenopathy.  Neurological: She is alert. Coordination normal.  Skin: Skin is warm and dry. No rash noted. She is not diaphoretic. No pallor.  Psychiatric: She has a normal mood and affect.  Nursing note and vitals reviewed.   ED Course  Procedures (including critical care time) Labs Review Labs Reviewed  URINALYSIS, ROUTINE W REFLEX MICROSCOPIC (NOT [facility]) - Abnormal;  Notable for the following:    Hgb urine dipstick SMALL (*)    All other components within normal limits  URINE MICROSCOPIC-ADD ON - Abnormal; Notable for the following:    Squamous Epithelial / LPF 0-5 (*)    Bacteria, UA RARE (*)    All other components within normal limits  COMPREHENSIVE METABOLIC PANEL - Abnormal; Notable for the following:    BUN 25 (*)    All other components within normal limits  CBC WITH DIFFERENTIAL/PLATELET - Abnormal; Notable for the following:    RBC 3.71 (*)    Hemoglobin 10.3 (*)    HCT 31.8 (*)    All other components within normal limits  OCCULT BLOOD X 1 CARD TO LAB, STOOL - Abnormal; Notable for the following:    Fecal Occult Bld POSITIVE (*)    All other components within normal limits  LIPASE, BLOOD    Imaging Review No results found. I have personally reviewed and evaluated these images and lab results as part of my medical decision-making.   EKG Interpretation None      MDM  Suspect upper GI bleed. Patient is well-appearing with stable vitals. No emergent signs of anemia. Patient to follow-up outpatient with GI for further evaluation of her melena. Patient has appointment scheduled with PCP tomorrow. She agrees to be proactive with her follow-up. Strict return precautions discussed. Patient also evaluated by Dr. when he is in agreement with plan. Patient understands and agrees with plan.   Final diagnoses:  Gastrointestinal hemorrhage with melena  Upper abdominal pain  Anemia, unspecified anemia type       Emi Holes, PA-C 01/21/16 0038  Leta Baptist, MD 01/27/16 2022

## 2016-01-20 NOTE — ED Notes (Signed)
PA at bedside.

## 2016-01-20 NOTE — ED Notes (Addendum)
Pt c/o dark, tarry, loose stools, three episodes since yesterday with upper abdominal pain that is intermittent.  She has not changed medications or diet.  Denies dizziness or SOB, no vomiting or fever either. Pt now states she has been taking a lot of excedrin migraine with ASA recently

## 2016-01-20 NOTE — Discharge Instructions (Signed)
Medications: Omeprazole  Treatment: Begin taking omeprazole daily. You may also take Zantac as needed.  Follow-up: Please follow-up with agastrointestinal doctor as soon as possible. You may go to the 1 you were referred to previously or the one mentioned above. Please follow-up with your primary care doctor at her scheduled appointment tomorrow. Please return to emergency department if you develop chest pain, shortness of breath, dizziness, lightheadedness, or any other new or concerning symptoms.   Gastrointestinal Bleeding Gastrointestinal (GI) bleeding means there is bleeding somewhere along the digestive tract, between the mouth and anus. CAUSES  There are many different problems that can cause GI bleeding. Possible causes include:  Esophagitis. This is inflammation, irritation, or swelling of the esophagus.  Hemorrhoids.These are veins that are full of blood (engorged) in the rectum. They cause pain, inflammation, and may bleed.  Anal fissures.These are areas of painful tearing which may bleed. They are often caused by passing hard stool.  Diverticulosis.These are pouches that form on the colon over time, with age, and may bleed significantly.  Diverticulitis.This is inflammation in areas with diverticulosis. It can cause pain, fever, and bloody stools, although bleeding is rare.  Polyps and cancer. Colon cancer often starts out as precancerous polyps.  Gastritis and ulcers.Bleeding from the upper gastrointestinal tract (near the stomach) may travel through the intestines and produce black, sometimes tarry, often bad smelling stools. In certain cases, if the bleeding is fast enough, the stools may not be black, but red. This condition may be life-threatening. SYMPTOMS   Vomiting bright red blood or material that looks like coffee grounds.  Bloody, black, or tarry stools. DIAGNOSIS  Your caregiver may diagnose your condition by taking your history and performing a physical  exam. More tests may be needed, including:  X-rays and other imaging tests.  Esophagogastroduodenoscopy (EGD). This test uses a flexible, lighted tube to look at your esophagus, stomach, and small intestine.  Colonoscopy. This test uses a flexible, lighted tube to look at your colon. TREATMENT  Treatment depends on the cause of your bleeding.   For bleeding from the esophagus, stomach, small intestine, or colon, the caregiver doing your EGD or colonoscopy may be able to stop the bleeding as part of the procedure.  Inflammation or infection of the colon can be treated with medicines.  Many rectal problems can be treated with creams, suppositories, or warm baths.  Surgery is sometimes needed.  Blood transfusions are sometimes needed if you have lost a lot of blood. If bleeding is slow, you may be allowed to go home. If there is a lot of bleeding, you will need to stay in the hospital for observation. HOME CARE INSTRUCTIONS   Take any medicines exactly as prescribed.  Keep your stools soft by eating foods that are high in fiber. These foods include whole grains, legumes, fruits, and vegetables. Prunes (1 to 3 a day) work well for many people.  Drink enough fluids to keep your urine clear or pale yellow. SEEK IMMEDIATE MEDICAL CARE IF:   Your bleeding increases.  You feel lightheaded, weak, or you faint.  You have severe cramps in your back or abdomen.  You pass large blood clots in your stool.  Your problems are getting worse. MAKE SURE YOU:   Understand these instructions.  Will watch your condition.  Will get help right away if you are not doing well or get worse.   This information is not intended to replace advice given to you by your health care provider.  Make sure you discuss any questions you have with your health care provider.   Document Released: 09/30/2000 Document Revised: 09/19/2012 Document Reviewed: 03/23/2015 Elsevier Interactive Patient Education 2016  [employer]  Abdominal Pain, Adult Many things can cause abdominal pain. Usually, abdominal pain is not caused by a disease and will improve without treatment. It can often be observed and treated at home. Your health care provider will do a physical exam and possibly order blood tests and X-rays to help determine the seriousness of your pain. However, in many cases, more time must pass before a clear cause of the pain can be found. Before that point, your health care provider may not know if you need more testing or further treatment. HOME CARE INSTRUCTIONS Monitor your abdominal pain for any changes. The following actions may help to alleviate any discomfort you are experiencing:  Only take over-the-counter or prescription medicines as directed by your health care provider.  Do not take laxatives unless directed to do so by your health care provider.  Try a clear liquid diet (broth, tea, or water) as directed by your health care provider. Slowly move to a bland diet as tolerated. SEEK MEDICAL CARE IF:  You have unexplained abdominal pain.  You have abdominal pain associated with nausea or diarrhea.  You have pain when you urinate or have a bowel movement.  You experience abdominal pain that wakes you in the night.  You have abdominal pain that is worsened or improved by eating food.  You have abdominal pain that is worsened with eating fatty foods.  You have a fever. SEEK IMMEDIATE MEDICAL CARE IF:  Your pain does not go away within 2 hours.  You keep throwing up (vomiting).  Your pain is felt only in portions of the abdomen, such as the right side or the left lower portion of the abdomen.  You pass bloody or black tarry stools. MAKE SURE YOU:  Understand these instructions.  Will watch your condition.  Will get help right away if you are not doing well or get worse.   This information is not intended to replace advice given to you by your health care provider. Make  sure you discuss any questions you have with your health care provider.   Document Released: 07/13/2005 Document Revised: 06/24/2015 Document Reviewed: 06/12/2013 Elsevier Interactive Patient Education 2016 [employer].   Anemia, Nonspecific Anemia is a condition in which the concentration of red blood cells or hemoglobin in the blood is below normal. Hemoglobin is a substance in red blood cells that carries oxygen to the tissues of the body. Anemia results in not enough oxygen reaching these tissues.  CAUSES  Common causes of anemia include:   Excessive bleeding. Bleeding may be internal or external. This includes excessive bleeding from periods (in women) or from the intestine.   Poor nutrition.   Chronic kidney, thyroid, and liver disease.  Bone marrow disorders that decrease red blood cell production.  Cancer and treatments for cancer.  HIV, AIDS, and their treatments.  Spleen problems that increase red blood cell destruction.  Blood disorders.  Excess destruction of red blood cells due to infection, medicines, and autoimmune disorders. SIGNS AND SYMPTOMS   Minor weakness.   Dizziness.   Headache.  Palpitations.   Shortness of breath, especially with exercise.   Paleness.  Cold sensitivity.  Indigestion.  Nausea.  Difficulty sleeping.  Difficulty concentrating. Symptoms may occur suddenly or they may develop slowly.  DIAGNOSIS  Additional blood tests are often  needed. These help your health care provider determine the best treatment. Your health care provider will check your stool for blood and look for other causes of blood loss.  TREATMENT  Treatment varies depending on the cause of the anemia. Treatment can include:   Supplements of iron, vitamin B12, or folic acid.   Hormone medicines.   A blood transfusion. This may be needed if blood loss is severe.   Hospitalization. This may be needed if there is significant continual blood loss.    Dietary changes.  Spleen removal. HOME CARE INSTRUCTIONS Keep all follow-up appointments. It often takes many weeks to correct anemia, and having your health care provider check on your condition and your response to treatment is very important. SEEK IMMEDIATE MEDICAL CARE IF:   You develop extreme weakness, shortness of breath, or chest pain.   You become dizzy or have trouble concentrating.  You develop heavy vaginal bleeding.   You develop a rash.   You have bloody or black, tarry stools.   You faint.   You vomit up blood.   You vomit repeatedly.   You have abdominal pain.  You have a fever or persistent symptoms for more than 2-3 days.   You have a fever and your symptoms suddenly get worse.   You are dehydrated.  MAKE SURE YOU:  Understand these instructions.  Will watch your condition.  Will get help right away if you are not doing well or get worse.   This information is not intended to replace advice given to you by your health care provider. Make sure you discuss any questions you have with your health care provider.   Document Released: 11/10/2004 Document Revised: 06/05/2013 Document Reviewed: 03/29/2013 Elsevier Interactive Patient Education [employer]

## 2017-06-02 ENCOUNTER — Encounter (HOSPITAL_COMMUNITY): Payer: Self-pay | Admitting: Emergency Medicine

## 2017-06-02 ENCOUNTER — Ambulatory Visit (HOSPITAL_COMMUNITY)
Admission: EM | Admit: 2017-06-02 | Discharge: 2017-06-02 | Disposition: A | Discharge status: Home / Self Care | Attending: Family Medicine | Admitting: Family Medicine

## 2017-06-02 DIAGNOSIS — M25562 Pain in left knee: Secondary | ICD-10-CM

## 2017-06-02 DIAGNOSIS — W19XXXA Unspecified fall, initial encounter: Secondary | ICD-10-CM

## 2017-06-02 MED ORDER — DICLOFENAC SODIUM 1 % TD GEL: 2.0000 g | Freq: Four times a day (QID) | TRANSDERMAL | 0 refills | Status: AC
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
## 2017-06-02 NOTE — Discharge Instructions (Signed)
Given history of GI bleed, will avoid oral NSAIDs. Start Voltaren gel as directed. Ice compress and elevation. Knee brace for compression. Monitor for any worsening of symptoms, symptoms not resolving, numbness, tingling, follow-up with orthopedics for further evaluation and management of knee pain.
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
## 2018-12-11 ENCOUNTER — Other Ambulatory Visit: Payer: Self-pay

## 2018-12-11 ENCOUNTER — Emergency Department (HOSPITAL_BASED_OUTPATIENT_CLINIC_OR_DEPARTMENT_OTHER)
Admission: EM | Admit: 2018-12-11 | Discharge: 2018-12-11 | Disposition: A | Discharge status: Home / Self Care | Attending: Emergency Medicine | Admitting: Emergency Medicine

## 2018-12-11 ENCOUNTER — Emergency Department (HOSPITAL_BASED_OUTPATIENT_CLINIC_OR_DEPARTMENT_OTHER)

## 2018-12-11 ENCOUNTER — Encounter (HOSPITAL_BASED_OUTPATIENT_CLINIC_OR_DEPARTMENT_OTHER): Payer: Self-pay | Admitting: *Deleted

## 2018-12-11 DIAGNOSIS — W540XXA Bitten by dog, initial encounter: Secondary | ICD-10-CM | POA: Insufficient documentation

## 2018-12-11 DIAGNOSIS — I10 Essential (primary) hypertension: Secondary | ICD-10-CM | POA: Insufficient documentation

## 2018-12-11 DIAGNOSIS — Z23 Encounter for immunization: Secondary | ICD-10-CM | POA: Insufficient documentation

## 2018-12-11 DIAGNOSIS — Z79899 Other long term (current) drug therapy: Secondary | ICD-10-CM | POA: Diagnosis not present

## 2018-12-11 DIAGNOSIS — Y9389 Activity, other specified: Secondary | ICD-10-CM | POA: Insufficient documentation

## 2018-12-11 DIAGNOSIS — Y998 Other external cause status: Secondary | ICD-10-CM | POA: Insufficient documentation

## 2018-12-11 DIAGNOSIS — Y929 Unspecified place or not applicable: Secondary | ICD-10-CM | POA: Diagnosis not present

## 2018-12-11 DIAGNOSIS — S60571A Other superficial bite of hand of right hand, initial encounter: Secondary | ICD-10-CM | POA: Insufficient documentation

## 2018-12-11 MED ORDER — AMOXICILLIN-POT CLAVULANATE 875-125 MG PO TABS: 1.0000 | ORAL_TABLET | Freq: Once | ORAL | Status: DC

## 2018-12-11 MED ORDER — HYDROCODONE-ACETAMINOPHEN 5-325 MG PO TABS: 1.0000 | ORAL_TABLET | ORAL | 0 refills | Status: DC | PRN

## 2018-12-11 MED ORDER — LIDOCAINE-EPINEPHRINE-TETRACAINE (LET) SOLUTION
3.0000 mL | Freq: Once | NASAL | Status: AC
  Administered 2018-12-11: 3 mL via TOPICAL
  Filled 2018-12-11: qty 3

## 2018-12-11 MED ORDER — HYDROCODONE-ACETAMINOPHEN 5-325 MG PO TABS: 1.0000 | ORAL_TABLET | Freq: Once | ORAL | Status: DC

## 2018-12-11 MED ORDER — TETANUS-DIPHTH-ACELL PERTUSSIS 5-2.5-18.5 LF-MCG/0.5 IM SUSP
0.5000 mL | Freq: Once | INTRAMUSCULAR | Status: AC
  Administered 2018-12-11: 0.5 mL via INTRAMUSCULAR
  Filled 2018-12-11: qty 0.5

## 2018-12-11 MED ORDER — AMOXICILLIN-POT CLAVULANATE 875-125 MG PO TABS: 1.0000 | ORAL_TABLET | Freq: Two times a day (BID) | ORAL | 0 refills | Status: DC

## 2018-12-11 NOTE — ED Provider Notes (Signed)
[facility] EMERGENCY DEPARTMENT Provider Note   CSN: [id number] Arrival date & time: 12/11/18  1840    History   Chief Complaint Chief Complaint  Patient presents with  . Animal Bite    HPI Amanda Hansen is a 58 y.o. female with a past medical history of hypertension, who presents today for evaluation of a dog bite.  She reports that her dog was barking at another dog and bit her on the right hand when she attempted to calm the dog down.  This happened approximately 3 hours prior to arrival.  She reports that her dog is up-to-date on all vaccines.  She is unsure when her last tetanus shot was.  She denies any numbness or tingling.  She reports multiple lacerations and areas of swelling on her right hand.      HPI  Past Medical History:  Diagnosis Date  . Arthritis   . Chest pain   . Hypertension   . Migraine     Patient Active Problem List   Diagnosis Date Noted  . Hypertension   . Arthritis   . Migraine   . Chest pain     Past Surgical History:  Procedure Laterality Date  . CESAREAN SECTION  1994  . HAND TENDON SURGERY    . KNEE SURGERY       OB History   No obstetric history on file.      Home Medications    Prior to Admission medications   Medication Sig Start Date End Date Taking? Authorizing Provider  amLODipine-olmesartan (AZOR) 10-40 MG per tablet Take 1 tablet by mouth daily.    [provider]  amoxicillin-clavulanate (AUGMENTIN) 875-125 MG tablet Take 1 tablet by mouth every 12 (twelve) hours. 12/11/18   Cristina Gong, PA-C  aspirin-acetaminophen-caffeine (EXCEDRIN MIGRAINE) [phone] MG per tablet Take 2 tablets by mouth every 6 (six) hours as needed for migraine.    [provider]  diclofenac sodium (VOLTAREN) 1 % GEL Apply 2 g topically 4 (four) times daily. 06/02/17   Cathie Hoops, Amy V, PA-C  HYDROcodone-acetaminophen (NORCO/VICODIN) 5-325 MG tablet Take 1 tablet by mouth every 4 (four) hours as needed. 12/11/18    Cristina Gong, PA-C  meloxicam (MOBIC) 15 MG tablet Take 15 mg by mouth daily.    [provider]  methocarbamol (ROBAXIN) 500 MG tablet Take 500 mg by mouth 4 (four) times daily.    [provider]  omeprazole (PRILOSEC) 20 MG capsule Take 1 capsule (20 mg total) by mouth daily. 01/20/16   Law, Waylan Boga, PA-C  promethazine (PHENERGAN) 25 MG tablet Take 25 mg by mouth every 6 (six) hours as needed for nausea or vomiting.    [provider]    Family History Family History  Problem Relation Age of Onset  . Hypertension Mother   . Heart Problems Mother   . Diabetes Father   . Kidney failure Father     Social History Social History   Tobacco Use  . Smoking status: Never Smoker  . Smokeless tobacco: Never Used  Substance Use Topics  . Alcohol use: No  . Drug use: No     Allergies   Dhea [nutritional supplements]; Imitrex [sumatriptan]; Stadol [butorphanol]; Tegretol [carbamazepine]; and Topamax [topiramate]   Review of Systems Review of Systems  Constitutional: Negative for chills and fever.  Skin: Positive for wound.  Neurological: Negative for weakness and headaches.  All other systems reviewed and are negative.    Physical Exam Updated  Vital Signs BP 133/83 (BP Location: Left Arm)   Pulse (!) 103   Temp 98.2 F (36.8 C) (Oral)   Resp 18   Ht  (1.702 m)   Wt 87.1 kg   SpO2 99%   BMI 30.07 kg/m   Physical Exam Vitals signs and nursing note reviewed.  Constitutional:      General: She is not in acute distress.    Appearance: She is not ill-appearing.  HENT:     Head: Normocephalic.  Cardiovascular:     Rate and Rhythm: Normal rate.     Pulses: Normal pulses.  Pulmonary:     Effort: Pulmonary effort is normal. No respiratory distress.  Musculoskeletal:     Comments: There is swelling and tenderness to palpation on the distal aspect of the third and fourth MCP.  There is a puncture in between the third and fourth  distal MCP.  Skin:    General: Skin is warm and dry.     Comments: Please see clinical image.  The is a 1.5cm laceration on the dorsum of the right thumb.  There is a 0.5cm laceration proximally.   Neurological:     General: No focal deficit present.     Mental Status: She is alert. Mental status is at baseline.  Psychiatric:        Mood and Affect: Mood normal.            ED Treatments / Results  Labs (all labs ordered are listed, but only abnormal results are displayed) Labs Reviewed - No data to display  EKG None  Radiology Dg Hand Complete Right  Result Date: 12/11/2018 CLINICAL DATA:  Dog bite to the RIGHT hand earlier today with puncture wounds involving the thumb and index finger. Initial encounter. EXAM: RIGHT HAND - COMPLETE 3+ VIEW COMPARISON:  None. FINDINGS: Soft tissue injury overlying the first MCP joint. No evidence of acute fracture or dislocation. Joint spaces well-preserved. Bone mineral density well-preserved. No opaque foreign bodies in the soft tissues. IMPRESSION: No osseous abnormality. No opaque foreign bodies in the soft tissues. Electronically Signed   By: Hulan Saas M.D.   On: 12/11/2018 20:09    Procedures Irrigation Date/Time: 12/12/2018 12:27 AM Performed by: Cristina Gong, PA-C Authorized by: Cristina Gong, PA-C  Consent: Verbal consent obtained. Risks and benefits: risks, benefits and alternatives were discussed Local anesthesia used: yes  Anesthesia: Local anesthesia used: yes Local Anesthetic: LET (lido,epi,tetracaine) Patient tolerance: Patient tolerated the procedure well with no immediate complications    (including critical care time)  Medications Ordered in ED Medications  amoxicillin-clavulanate (AUGMENTIN) 875-125 MG per tablet 1 tablet (has no administration in time range)  HYDROcodone-acetaminophen (NORCO/VICODIN) 5-325 MG per tablet 1 tablet (has no administration in time range)  Tdap (BOOSTRIX)  injection 0.5 mL (0.5 mLs Intramuscular Given 12/11/18 1941)  lidocaine-EPINEPHrine-tetracaine (LET) solution (3 mLs Topical Given 12/11/18 2021)     Initial Impression / Assessment and Plan / ED Course  I have reviewed the triage vital signs and the nursing notes.  Pertinent labs & imaging results that were available during my care of the patient were reviewed by me and considered in my medical decision making (see chart for details).       Patient presents with laceration from a dog bite.  Pt wounds irrigated well with sterile saline.  Wounds examined with visualization of the base and no foreign bodies seen.  Pt Alert and oriented, NAD, nontoxic, nonseptic appearing.  Capillary  refill intact and pt without neurologic deficit.   x-rays with no osseous abnormalities or broken teeth. Patient tetanus updated.  Pain treated in the emergency department. Wounds not closed secondary to concern for infection.  Did discuss with patient closure of the largest wound as it is slightly gaping, however discussed increased risk of infection.  Given that risks she elected for secondary closure.  Antibiotic ointment was placed along with nonstick dressing, and she was placed in a thumb spica splint to help facilitate healing.  We'll discharge home with pain medication, Augmentin and requests for close follow-up with PCP or back in the ER.   Return precautions were discussed with patient who states their understanding.  At the time of discharge patient denied any unaddressed complaints or concerns.  Patient is agreeable for discharge home.   Final Clinical Impressions(s) / ED Diagnoses   Final diagnoses:  Dog bite, initial encounter    ED Discharge Orders         Ordered    amoxicillin-clavulanate (AUGMENTIN) 875-125 MG tablet  Every 12 hours     12/11/18 2105    HYDROcodone-acetaminophen (NORCO/VICODIN) 5-325 MG tablet  Every 4 hours PRN     12/11/18 2105           Cristina Gong,  PA-C 12/12/18 0029    Little, Ambrose Finland, MD 12/14/18 [phone]

## 2018-12-11 NOTE — ED Notes (Signed)
PT states their dog is up to date on shots.

## 2018-12-11 NOTE — ED Triage Notes (Signed)
Pt reports she was bitten by her own dog on right hand this evening. States dog was barking at another dog through the window and bit her when she waved her hands to calm him down. Several lacerations noted to base of right thumb with bleeding controlled

## 2018-12-11 NOTE — ED Notes (Signed)
ED Provider at bedside. 

## 2018-12-11 NOTE — Discharge Instructions (Addendum)
Tomorrow night you may remove your dressing.  Please do not submerge or soak your wounds.  After tomorrow you may get them wet with running water and let soap run over them, pat them dry and redress them.  Please get your wound checked in the next 1 to 2 days.  Please seek additional medical care sooner if you have any concerns.  Please take Ibuprofen (Advil, motrin) and Tylenol (acetaminophen) to relieve your pain.  You may take up to 600 MG (3 pills) of normal strength ibuprofen every 8 hours as needed.  In between doses of ibuprofen you make take tylenol, up to 1,000 mg (two extra strength pills).  Do not take more than 3,000 mg tylenol in a 24 hour period.  Please check all medication labels as many medications such as pain and cold medications may contain tylenol.  Do not drink alcohol while taking these medications.  Do not take other NSAID'S while taking ibuprofen (such as aleve or naproxen).  Please take ibuprofen with food to decrease stomach upset.  Today you received medications that may make you sleepy or impair your ability to make decisions.  For the next 24 hours please do not drive, operate heavy machinery, care for a small child with out another adult present, or perform any activities that may cause harm to you or someone else if you were to fall asleep or be impaired.   You are being prescribed a medication which may make you sleepy. Please follow up of listed precautions for at least 24 hours after taking one dose.  You may have diarrhea from the antibiotics.  It is very important that you continue to take the antibiotics even if you get diarrhea unless a medical professional tells you that you may stop taking them.  If you stop too early the bacteria you are being treated for will become stronger and you may need different, more powerful antibiotics that have more side effects and worsening diarrhea.  Please stay well hydrated and consider probiotics as they may decrease the severity of  your diarrhea.

## 2018-12-13 ENCOUNTER — Emergency Department (HOSPITAL_BASED_OUTPATIENT_CLINIC_OR_DEPARTMENT_OTHER)
Admission: EM | Admit: 2018-12-13 | Discharge: 2018-12-13 | Disposition: A | Discharge status: Home / Self Care | Attending: Emergency Medicine | Admitting: Emergency Medicine

## 2018-12-13 ENCOUNTER — Encounter (HOSPITAL_BASED_OUTPATIENT_CLINIC_OR_DEPARTMENT_OTHER): Payer: Self-pay

## 2018-12-13 ENCOUNTER — Other Ambulatory Visit: Payer: Self-pay

## 2018-12-13 DIAGNOSIS — Y939 Activity, unspecified: Secondary | ICD-10-CM | POA: Diagnosis not present

## 2018-12-13 DIAGNOSIS — I10 Essential (primary) hypertension: Secondary | ICD-10-CM | POA: Diagnosis not present

## 2018-12-13 DIAGNOSIS — Y929 Unspecified place or not applicable: Secondary | ICD-10-CM | POA: Diagnosis not present

## 2018-12-13 DIAGNOSIS — Z79899 Other long term (current) drug therapy: Secondary | ICD-10-CM | POA: Insufficient documentation

## 2018-12-13 DIAGNOSIS — S61411A Laceration without foreign body of right hand, initial encounter: Secondary | ICD-10-CM | POA: Insufficient documentation

## 2018-12-13 DIAGNOSIS — W540XXA Bitten by dog, initial encounter: Secondary | ICD-10-CM | POA: Insufficient documentation

## 2018-12-13 DIAGNOSIS — Y999 Unspecified external cause status: Secondary | ICD-10-CM | POA: Diagnosis not present

## 2018-12-13 NOTE — ED Triage Notes (Signed)
Pt for recheck of dog bite to right hand-seen here on 2/25-pt had dsg and velcro splint in place-NAD-steady gait

## 2018-12-13 NOTE — ED Provider Notes (Signed)
[facility] EMERGENCY DEPARTMENT Provider Note   CSN: [id number] Arrival date & time: 12/13/18  1122    History   Chief Complaint No chief complaint on file.   HPI Amanda Hansen is a 58 y.o. female.     Pt presents to the ED today for a wound check.  She was bitten on her right hand by her dog on 2/25.  Due to risk of infection, lac was not sutured.  Pt was d/c home on abx.  She did get those filled and has been taking them.  Hand feels good.     Past Medical History:  Diagnosis Date  . Arthritis   . Chest pain   . Hypertension   . Migraine     Patient Active Problem List   Diagnosis Date Noted  . Hypertension   . Arthritis   . Migraine   . Chest pain     Past Surgical History:  Procedure Laterality Date  . CESAREAN SECTION  1994  . HAND TENDON SURGERY    . KNEE SURGERY       OB History   No obstetric history on file.      Home Medications    Prior to Admission medications   Medication Sig Start Date End Date Taking? Authorizing Provider  amLODipine-olmesartan (AZOR) 10-40 MG per tablet Take 1 tablet by mouth daily.    [provider]  amoxicillin-clavulanate (AUGMENTIN) 875-125 MG tablet Take 1 tablet by mouth every 12 (twelve) hours. 12/11/18   Cristina Gong, PA-C  aspirin-acetaminophen-caffeine (EXCEDRIN MIGRAINE) [phone] MG per tablet Take 2 tablets by mouth every 6 (six) hours as needed for migraine.    [provider]  diclofenac sodium (VOLTAREN) 1 % GEL Apply 2 g topically 4 (four) times daily. 06/02/17   Cathie Hoops, Amy V, PA-C  HYDROcodone-acetaminophen (NORCO/VICODIN) 5-325 MG tablet Take 1 tablet by mouth every 4 (four) hours as needed. 12/11/18   Cristina Gong, PA-C  meloxicam (MOBIC) 15 MG tablet Take 15 mg by mouth daily.    [provider]  methocarbamol (ROBAXIN) 500 MG tablet Take 500 mg by mouth 4 (four) times daily.    [provider]  omeprazole (PRILOSEC) 20 MG capsule Take 1  capsule (20 mg total) by mouth daily. 01/20/16   Law, Waylan Boga, PA-C  promethazine (PHENERGAN) 25 MG tablet Take 25 mg by mouth every 6 (six) hours as needed for nausea or vomiting.    [provider]    Family History Family History  Problem Relation Age of Onset  . Hypertension Mother   . Heart Problems Mother   . Diabetes Father   . Kidney failure Father     Social History Social History   Tobacco Use  . Smoking status: Never Smoker  . Smokeless tobacco: Never Used  Substance Use Topics  . Alcohol use: No  . Drug use: No     Allergies   Dhea [nutritional supplements]; Imitrex [sumatriptan]; Stadol [butorphanol]; Tegretol [carbamazepine]; and Topamax [topiramate]   Review of Systems Review of Systems  Skin: Positive for wound.  All other systems reviewed and are negative.    Physical Exam Updated Vital Signs BP (!) 142/75 (BP Location: Left Arm)   Pulse 89   Temp 98 F (36.7 C) (Oral)   Resp 18   Ht 5\' 7"  (1.702 m)   Wt 87.1 kg   SpO2 98%   BMI 30.07 kg/m   Physical Exam Vitals signs and nursing note reviewed.  Constitutional:      Appearance: Normal appearance.  HENT:     Head: Normocephalic and atraumatic.     Right Ear: External ear normal.     Left Ear: External ear normal.     Nose: Nose normal.     Mouth/Throat:     Mouth: Mucous membranes are moist.  Eyes:     Pupils: Pupils are equal, round, and reactive to light.  Neck:     Musculoskeletal: Normal range of motion and neck supple.  Cardiovascular:     Rate and Rhythm: Normal rate and regular rhythm.  Pulmonary:     Effort: Pulmonary effort is normal.     Breath sounds: Normal breath sounds.  Abdominal:     General: Abdomen is flat.  Musculoskeletal: Normal range of motion.  Skin:    General: Skin is warm.     Capillary Refill: Capillary refill takes less than 2 seconds.     Comments: Healing wounds to right hand.  No infection.  Neurological:     Mental Status: She is  alert and oriented to person, place, and time.  Psychiatric:        Mood and Affect: Mood normal.        Behavior: Behavior normal.      ED Treatments / Results  Labs (all labs ordered are listed, but only abnormal results are displayed) Labs Reviewed - No data to display  EKG None  Radiology Dg Hand Complete Right  Result Date: 12/11/2018 CLINICAL DATA:  Dog bite to the RIGHT hand earlier today with puncture wounds involving the thumb and index finger. Initial encounter. EXAM: RIGHT HAND - COMPLETE 3+ VIEW COMPARISON:  None. FINDINGS: Soft tissue injury overlying the first MCP joint. No evidence of acute fracture or dislocation. Joint spaces well-preserved. Bone mineral density well-preserved. No opaque foreign bodies in the soft tissues. IMPRESSION: No osseous abnormality. No opaque foreign bodies in the soft tissues. Electronically Signed   By: Hulan Saas M.D.   On: 12/11/2018 20:09    Procedures Procedures (including critical care time)  Medications Ordered in ED Medications - No data to display   Initial Impression / Assessment and Plan / ED Course  I have reviewed the triage vital signs and the nursing notes.  Pertinent labs & imaging results that were available during my care of the patient were reviewed by me and considered in my medical decision making (see chart for details).       Wound is healing well by secondary intention.  No cellulitis or wound infection.  Pt is stable for d/c.  Final Clinical Impressions(s) / ED Diagnoses   Final diagnoses:  Dog bite, initial encounter    ED Discharge Orders    None       Jacalyn Lefevre, MD 12/13/18 1143

## 2018-12-13 NOTE — Discharge Instructions (Signed)
Continue antibiotics.  Return if worse.

## 2020-01-25 ENCOUNTER — Encounter (HOSPITAL_BASED_OUTPATIENT_CLINIC_OR_DEPARTMENT_OTHER): Payer: Self-pay | Admitting: Emergency Medicine

## 2020-01-25 ENCOUNTER — Emergency Department (HOSPITAL_BASED_OUTPATIENT_CLINIC_OR_DEPARTMENT_OTHER)
Admission: EM | Admit: 2020-01-25 | Discharge: 2020-01-25 | Disposition: A | Discharge status: Home / Self Care | Attending: Emergency Medicine | Admitting: Emergency Medicine

## 2020-01-25 ENCOUNTER — Other Ambulatory Visit: Payer: Self-pay

## 2020-01-25 DIAGNOSIS — Y999 Unspecified external cause status: Secondary | ICD-10-CM | POA: Insufficient documentation

## 2020-01-25 DIAGNOSIS — I10 Essential (primary) hypertension: Secondary | ICD-10-CM | POA: Insufficient documentation

## 2020-01-25 DIAGNOSIS — L03115 Cellulitis of right lower limb: Secondary | ICD-10-CM | POA: Diagnosis not present

## 2020-01-25 DIAGNOSIS — Y9289 Other specified places as the place of occurrence of the external cause: Secondary | ICD-10-CM | POA: Diagnosis not present

## 2020-01-25 DIAGNOSIS — Z79899 Other long term (current) drug therapy: Secondary | ICD-10-CM | POA: Diagnosis not present

## 2020-01-25 DIAGNOSIS — Y9389 Activity, other specified: Secondary | ICD-10-CM | POA: Diagnosis not present

## 2020-01-25 DIAGNOSIS — S80861A Insect bite (nonvenomous), right lower leg, initial encounter: Secondary | ICD-10-CM

## 2020-01-25 DIAGNOSIS — W57XXXA Bitten or stung by nonvenomous insect and other nonvenomous arthropods, initial encounter: Secondary | ICD-10-CM | POA: Insufficient documentation

## 2020-01-25 MED ORDER — DOXYCYCLINE HYCLATE 100 MG PO CAPS: 100.0000 mg | ORAL_CAPSULE | Freq: Two times a day (BID) | ORAL | 0 refills | Status: AC

## 2020-01-25 MED ORDER — DOXYCYCLINE HYCLATE 100 MG PO TABS
100.0000 mg | ORAL_TABLET | Freq: Once | ORAL | Status: AC
  Administered 2020-01-25: 19:00:00 100 mg via ORAL
  Filled 2020-01-25: qty 1

## 2020-01-25 NOTE — ED Triage Notes (Signed)
Insect bite to R calf and R thigh. States it is painful

## 2020-01-25 NOTE — ED Provider Notes (Signed)
[facility] EMERGENCY DEPARTMENT Provider Note   CSN: [id number] Arrival date & time: 01/25/20  1805     History Chief Complaint  Patient presents with  . Insect Bite    Amanda Hansen is a 59 y.o. female husband history of arthritis, hypertension who presents for evaluation of insect bite noted to right calf and right thigh that began about 3 days ago.  She states she did not visualize anything bit her but did feel something bite her.  She reports that afterwards she noticed a small wound.  She reports that the 1 to the right thigh has been minimal and does not cause her much issues.  She reports the 1 to the right calf started got more painful, more red, more swollen.  She states that yesterday, she started noticing it opening up and has been having some active drainage from it since then.  She states that it hurts more when she walks on it.  She did notice some mild ankle swelling noted yesterday.  She has not noted any fevers, nausea/vomiting.  Her tetanus shot is up-to-date.  The history is provided by the patient.       Past Medical History:  Diagnosis Date  . Arthritis   . Chest pain   . Hypertension   . Migraine     Patient Active Problem List   Diagnosis Date Noted  . Hypertension   . Arthritis   . Migraine   . Chest pain     Past Surgical History:  Procedure Laterality Date  . CESAREAN SECTION  1994  . HAND TENDON SURGERY    . KNEE SURGERY       OB History   No obstetric history on file.     Family History  Problem Relation Age of Onset  . Hypertension Mother   . Heart Problems Mother   . Diabetes Father   . Kidney failure Father     Social History   Tobacco Use  . Smoking status: Never Smoker  . Smokeless tobacco: Never Used  Substance Use Topics  . Alcohol use: No  . Drug use: No    Home Medications Prior to Admission medications   Medication Sig Start Date End Date Taking? Authorizing Provider  amLODipine-olmesartan (AZOR)  10-40 MG per tablet Take 1 tablet by mouth daily.    [provider]  amoxicillin-clavulanate (AUGMENTIN) 875-125 MG tablet Take 1 tablet by mouth every 12 (twelve) hours. 12/11/18   Cristina Gong, PA-C  aspirin-acetaminophen-caffeine (EXCEDRIN MIGRAINE) [phone] MG per tablet Take 2 tablets by mouth every 6 (six) hours as needed for migraine.    [provider]  diclofenac sodium (VOLTAREN) 1 % GEL Apply 2 g topically 4 (four) times daily. 06/02/17   Cathie Hoops, Amy V, PA-C  doxycycline (VIBRAMYCIN) 100 MG capsule Take 1 capsule (100 mg total) by mouth 2 (two) times daily for 7 days. 01/25/20 02/01/20  Maxwell Caul, PA-C  HYDROcodone-acetaminophen (NORCO/VICODIN) 5-325 MG tablet Take 1 tablet by mouth every 4 (four) hours as needed. 12/11/18   Cristina Gong, PA-C  meloxicam (MOBIC) 15 MG tablet Take 15 mg by mouth daily.    [provider]  methocarbamol (ROBAXIN) 500 MG tablet Take 500 mg by mouth 4 (four) times daily.    [provider]  omeprazole (PRILOSEC) 20 MG capsule Take 1 capsule (20 mg total) by mouth daily. 01/20/16   Law, Waylan Boga, PA-C  promethazine (PHENERGAN) 25 MG tablet Take 25 mg by  mouth every 6 (six) hours as needed for nausea or vomiting.    [provider]    Allergies    Dhea [nutritional supplements], Imitrex [sumatriptan], Stadol [butorphanol], Tegretol [carbamazepine], and Topamax [topiramate]  Review of Systems   Review of Systems  Constitutional: Negative for fever.  Gastrointestinal: Negative for nausea and vomiting.  Skin: Positive for color change and wound.  Neurological: Negative for weakness and numbness.  All other systems reviewed and are negative.   Physical Exam Updated Vital Signs BP (!) 141/76 (BP Location: Right Arm)   Pulse 82   Temp 99.2 F (37.3 C) (Oral)   Resp 16   Ht 5\' 7"  (1.702 m)   Wt 92.1 kg   SpO2 99%   BMI 31.79 kg/m   Physical Exam Vitals and nursing note reviewed.    Constitutional:      Appearance: She is well-developed.  HENT:     Head: Normocephalic and atraumatic.  Eyes:     General: No scleral icterus.       Right eye: No discharge.        Left eye: No discharge.     Conjunctiva/sclera: Conjunctivae normal.  Cardiovascular:     Pulses:          Dorsalis pedis pulses are 2+ on the right side.  Pulmonary:     Effort: Pulmonary effort is normal.  Musculoskeletal:     Comments: Tenderness palpation noted to the right lateral calf with an area that is about 5 cm in diameter with erythema, warmth.  There is an area of central fluctuance that is actively draining.  Minimal amount of soft tissue swelling to the ankle.  No overlying warmth, erythema, edema.  Dorsiflexion/plantarflexion of right ankle intact any difficulty.  No bony tenderness noted to right ankle.  Skin:    General: Skin is warm and dry.     Comments: Good distal cap refill.  RLE is not dusky in appearance or cool to touch.  Insect bite noted to right calf as documented musculoskeletal.  She has a small area of insect bite noted to the right thigh with no surrounding warmth, erythema.  No active drainage.  Neurological:     Mental Status: She is alert.  Psychiatric:        Speech: Speech normal.        Behavior: Behavior normal.       ED Results / Procedures / Treatments   Labs (all labs ordered are listed, but only abnormal results are displayed) Labs Reviewed - No data to display  EKG None  Radiology No results found.  Procedures Procedures (including critical care time)  Medications Ordered in ED Medications  doxycycline (VIBRA-TABS) tablet 100 mg (has no administration in time range)    ED Course  I have reviewed the triage vital signs and the nursing notes.  Pertinent labs & imaging results that were available during my care of the patient were reviewed by me and considered in my medical decision making (see chart for details).    MDM  Rules/Calculators/A&P                      59 year old female who presents for evaluation of insect bite noted to right leg.  She thinks it may have happened 3 days ago.  She did not visualize anything bit her but felt something.  Had no fevers.  Since then, the 1 to her right calf has gotten more painful, red, swollen.  Yesterday started actively draining.  She also noticed some ankle swelling.  On initially arrival, she is afebrile nontoxic-appearing.  Vital signs are stable.  She is neurovascular intact.  She has an area that is about 5 cm in diameter with warmth, erythema that is actively draining.  Suspect this is an infected insect bite.  History/physical exam not concerning for ischemic limb, DVT.  Additionally, she has some very minimal soft tissue feeling into the ankle but no overlying warmth, erythema.  She has full range of motion.  History/physical exam not concerning for septic arthritis.  We will plan to treat her with antibiotics.  The area is opened up and is actively draining.  I did offer patient an I&D to further open up and express drainage.  Patient declined at this time.  Given that it is already open and draining I feel this is reasonable.  Patient with no known drug allergies.  Tetanus is up-to-date.  Area was marked and patient instructed to return to the emergency department if redness, swelling starts extending outside of the designated line. At this time, patient exhibits no emergent life-threatening condition that require further evaluation in ED or admission. Patient had ample opportunity for questions and discussion. All patient's questions were answered with full understanding. Strict return precautions discussed. Patient expresses understanding and agreement to plan.   Portions of this note were generated with [employer]. Dictation errors may occur despite best attempts at proofreading.   Final Clinical Impression(s) / ED Diagnoses Final diagnoses:  Insect  bite of right lower leg, initial encounter  Cellulitis of right lower extremity    Rx / DC Orders ED Discharge Orders         Ordered    doxycycline (VIBRAMYCIN) 100 MG capsule  2 times daily     01/25/20 1855           Volanda Napoleon, PA-C 01/25/20 1905    Little, Wenda Overland, MD 01/26/20 1458

## 2020-01-25 NOTE — Discharge Instructions (Signed)
Apply warm compresses as directed to continue help expressing drainage.  You can take Tylenol or Ibuprofen as directed for pain. You can alternate Tylenol and Ibuprofen every 4 hours. If you take Tylenol at 1pm, then you can take Ibuprofen at 5pm. Then you can take Tylenol again at 9pm.   Take antibiotics as directed. Please take all of your antibiotics until finished.  Follow-up if you have no improvement in symptoms in 72 hours.  Return emergency department if you start having fever, redness, swelling starts extending outside of the designated line, worsening pain, nausea/vomiting.

## 2021-06-14 ENCOUNTER — Emergency Department (HOSPITAL_BASED_OUTPATIENT_CLINIC_OR_DEPARTMENT_OTHER)
Admission: EM | Admit: 2021-06-14 | Discharge: 2021-06-14 | Disposition: A | Discharge status: Home / Self Care | Payer: Medicare Other | Attending: Emergency Medicine | Admitting: Emergency Medicine

## 2021-06-14 ENCOUNTER — Emergency Department (HOSPITAL_BASED_OUTPATIENT_CLINIC_OR_DEPARTMENT_OTHER): Payer: Medicare Other

## 2021-06-14 ENCOUNTER — Other Ambulatory Visit: Payer: Self-pay

## 2021-06-14 ENCOUNTER — Encounter (HOSPITAL_BASED_OUTPATIENT_CLINIC_OR_DEPARTMENT_OTHER): Payer: Self-pay

## 2021-06-14 DIAGNOSIS — S61411A Laceration without foreign body of right hand, initial encounter: Secondary | ICD-10-CM | POA: Insufficient documentation

## 2021-06-14 DIAGNOSIS — Z7982 Long term (current) use of aspirin: Secondary | ICD-10-CM | POA: Insufficient documentation

## 2021-06-14 DIAGNOSIS — S61451A Open bite of right hand, initial encounter: Secondary | ICD-10-CM | POA: Insufficient documentation

## 2021-06-14 DIAGNOSIS — Z79899 Other long term (current) drug therapy: Secondary | ICD-10-CM | POA: Insufficient documentation

## 2021-06-14 DIAGNOSIS — W540XXA Bitten by dog, initial encounter: Secondary | ICD-10-CM | POA: Diagnosis not present

## 2021-06-14 DIAGNOSIS — I10 Essential (primary) hypertension: Secondary | ICD-10-CM | POA: Insufficient documentation

## 2021-06-14 DIAGNOSIS — S6991XA Unspecified injury of right wrist, hand and finger(s), initial encounter: Secondary | ICD-10-CM | POA: Diagnosis present

## 2021-06-14 MED ORDER — HYDROCODONE-ACETAMINOPHEN 5-325 MG PO TABS
1.0000 | ORAL_TABLET | Freq: Once | ORAL | Status: AC
Start: 2021-06-14 — End: 2021-06-14
  Administered 2021-06-14: 1 via ORAL
  Filled 2021-06-14: qty 1

## 2021-06-14 MED ORDER — HYDROCODONE-ACETAMINOPHEN 5-325 MG PO TABS: 1.0000 | ORAL_TABLET | Freq: Four times a day (QID) | ORAL | 0 refills | Status: AC | PRN

## 2021-06-14 MED ORDER — PROMETHAZINE HCL 25 MG PO TABS
12.5000 mg | ORAL_TABLET | Freq: Once | ORAL | Status: AC
  Administered 2021-06-14: 12.5 mg via ORAL
  Filled 2021-06-14: qty 1

## 2021-06-14 MED ORDER — AMOXICILLIN-POT CLAVULANATE 875-125 MG PO TABS
1.0000 | ORAL_TABLET | Freq: Once | ORAL | Status: AC
  Administered 2021-06-14: 1 via ORAL
  Filled 2021-06-14: qty 1

## 2021-06-14 MED ORDER — AMOXICILLIN-POT CLAVULANATE 875-125 MG PO TABS: 1.0000 | ORAL_TABLET | Freq: Two times a day (BID) | ORAL | 0 refills | Status: AC

## 2021-06-14 NOTE — ED Notes (Signed)
Pain score added to allow discharge

## 2021-06-14 NOTE — ED Triage Notes (Signed)
Pt reports her dog bit her right hand ~1hour PTA-NAD-steady gait

## 2021-06-14 NOTE — ED Provider Notes (Signed)
[facility] EMERGENCY DEPARTMENT Provider Note   CSN: [id number] Arrival date & time: 06/14/21  1846     History Chief Complaint  Patient presents with   Animal Bite    Amanda Hansen is a 60 y.o. female right-hand-dominant who presents today for evaluation of a dog bite on her right hand.  She states that her dog accidentally bit her right hand.  She has been bit by her dog on the same hand before for which I personally saw her in 2020.    She has 9 out of 10 pain in her right hand.  She notes significant swelling into the right hand.  No pain in the right wrist, or forearm.   She denies any injuries other than her right hand.  She states that her dog is up-to-date on all of its rabies vaccines.  Her tetanus is up-to-date, as it was updated when she was bit by her dog in 2020.  She denies any other injuries or concerns.  HPI     Past Medical History:  Diagnosis Date   Arthritis    Chest pain    Hypertension    Migraine     Patient Active Problem List   Diagnosis Date Noted   Hypertension    Arthritis    Migraine    Chest pain     Past Surgical History:  Procedure Laterality Date   CESAREAN SECTION  1994   HAND TENDON SURGERY     KNEE SURGERY       OB History   No obstetric history on file.     Family History  Problem Relation Age of Onset   Hypertension Mother    Heart Problems Mother    Diabetes Father    Kidney failure Father     Social History   Tobacco Use   Smoking status: Never   Smokeless tobacco: Never  Vaping Use   Vaping Use: Never used  Substance Use Topics   Alcohol use: No   Drug use: No    Home Medications Prior to Admission medications   Medication Sig Start Date End Date Taking? Authorizing Provider  amoxicillin-clavulanate (AUGMENTIN) 875-125 MG tablet Take 1 tablet by mouth every 12 (twelve) hours for 10 days. 06/14/21 06/24/21 Yes Cristina Gong, PA-C  HYDROcodone-acetaminophen (NORCO/VICODIN) 5-325 MG  tablet Take 1 tablet by mouth every 6 (six) hours as needed for severe pain. 06/14/21  Yes Cristina Gong, PA-C  amLODipine-olmesartan (AZOR) 10-40 MG per tablet Take 1 tablet by mouth daily.    [provider]  aspirin-acetaminophen-caffeine (EXCEDRIN MIGRAINE) [phone] MG per tablet Take 2 tablets by mouth every 6 (six) hours as needed for migraine.    [provider]  diclofenac sodium (VOLTAREN) 1 % GEL Apply 2 g topically 4 (four) times daily. 06/02/17   Cathie Hoops, Amy V, PA-C  meloxicam (MOBIC) 15 MG tablet Take 15 mg by mouth daily.    [provider]  methocarbamol (ROBAXIN) 500 MG tablet Take 500 mg by mouth 4 (four) times daily.    [provider]  omeprazole (PRILOSEC) 20 MG capsule Take 1 capsule (20 mg total) by mouth daily. 01/20/16   Law, Waylan Boga, PA-C  promethazine (PHENERGAN) 25 MG tablet Take 25 mg by mouth every 6 (six) hours as needed for nausea or vomiting.    [provider]    Allergies    Dhea [nutritional supplements], Imitrex [sumatriptan], Stadol [butorphanol], Tegretol [carbamazepine], and Topamax [topiramate]  Review of  Systems   Review of Systems  Constitutional:  Negative for chills and fever.  Musculoskeletal:  Positive for joint swelling.  Skin:  Positive for wound.  Neurological:  Negative for weakness and numbness.  All other systems reviewed and are negative.  Physical Exam Updated Vital Signs BP 137/90 (BP Location: Left Arm)   Pulse 70   Temp 98.1 F (36.7 C) (Oral)   Resp 15   Ht 5\' 7"  (1.702 m)   Wt 92.5 kg   SpO2 100%   BMI 31.94 kg/m   Physical Exam Vitals and nursing note reviewed.  Constitutional:      General: She is not in acute distress.    Appearance: She is not ill-appearing.  HENT:     Head: Normocephalic.  Cardiovascular:     Rate and Rhythm: Normal rate.     Comments: Capillary refill 1 to 2 seconds of the fingers on the right hand.  Right hand distal to the wounds is warm,  feels well perfused Pulmonary:     Effort: Pulmonary effort is normal. No respiratory distress.  Musculoskeletal:     Comments: There is obvious edema on the left hand primarily along the ulnar aspect.  She is able to make a fist with active flexion extension of fingers on the right hand.  Skin:    Comments: Please see clinical image.  There is a 1 cm jagged laceration on the palmar aspect of the hand.  There are 2 subcentimeter wounds on the dorsal aspect of the hand without active bleeding.  They are on the ulnar side.  There is associated edema on the ulnar side distal to the wounds.  Neurological:     Mental Status: She is alert.     Comments: Sensation intact to light touch to fingers on right hand         ED Results / Procedures / Treatments   Labs (all labs ordered are listed, but only abnormal results are displayed) Labs Reviewed - No data to display  EKG None  Radiology DG Hand Complete Right  Result Date: 06/14/2021 CLINICAL DATA:  Dog bite EXAM: RIGHT HAND - COMPLETE 3+ VIEW COMPARISON:  None. FINDINGS: There is no evidence of fracture or dislocation. There is no evidence of arthropathy or other focal bone abnormality. Subcutaneus soft tissue edema and laceration with no retained radiopaque foreign body. IMPRESSION: 1. No acute displaced fracture or dislocation. 2. No retained radiopaque foreign body. Electronically Signed   By: 06/16/2021 M.D.   On: 06/14/2021 20:37    Procedures Procedures   Medications Ordered in ED Medications  amoxicillin-clavulanate (AUGMENTIN) 875-125 MG per tablet 1 tablet (1 tablet Oral Given 06/14/21 2234)  HYDROcodone-acetaminophen (NORCO/VICODIN) 5-325 MG per tablet 1 tablet (1 tablet Oral Given 06/14/21 2234)  promethazine (PHENERGAN) tablet 12.5 mg (12.5 mg Oral Given 06/14/21 2234)    ED Course  I have reviewed the triage vital signs and the nursing notes.  Pertinent labs & imaging results that were available during my care of  the patient were reviewed by me and considered in my medical decision making (see chart for details).    MDM Rules/Calculators/A&P                          Patient is a 60 year old woman who presents today for evaluation of a dog bite on her right hand.  The dog that bit her is her dog, she states that the dog is up-to-date  on all its vaccines including rabies without any known recent rabies exposures.  Her tetanus was updated in 2020 when I saw her for the same dog biting her on the same hand. Her wound were irrigated while in the emergency room.  Given the risk of infection, combined with the degree of swelling already there, that it is not in a highly cosmetic area, the lack of active bleeding, the dirty nature of the wounds and that they are not significantly large or gaping I discussed with patient that the risk of infection from suturing the wounds appears to outweigh any potential cosmetic or wound healing benefit of suturing.  We will leave the wounds open to allow for drainage as needed.  While in the ER she was given Augmentin, Vicodin, and Phenergan so she could tolerate the Vicodin.  [place] PMP consulted, she is given rx for Augmentin and Vicodin for continued pain management at home.  She has had both these medicines before and tolerated well.  Recommended that she get her wound rechecked in the next 2 days.  She is given hand specialty follow-up, and instructed that if she is unable to see them for wound check she needs to be seen in the emergency room, urgent care by another provider and she states her understanding.  RN to complete bite report.   Return precautions were discussed with patient who states their understanding.  At the time of discharge patient denied any unaddressed complaints or concerns.  Patient is agreeable for discharge home.  Note: Portions of this report may have been transcribed using voice recognition software. Every effort was made to ensure accuracy; however,  inadvertent computerized transcription errors may be present  Final Clinical Impression(s) / ED Diagnoses Final diagnoses:  Dog bite of right hand, initial encounter    Rx / DC Orders ED Discharge Orders          Ordered    HYDROcodone-acetaminophen (NORCO/VICODIN) 5-325 MG tablet  Every 6 hours PRN        06/14/21 2209    amoxicillin-clavulanate (AUGMENTIN) 875-125 MG tablet  Every 12 hours        06/14/21 2209             Cristina Gong, PA-C 06/15/21 0029    Rozelle Logan, DO 06/16/21 2018

## 2021-06-14 NOTE — Discharge Instructions (Addendum)
As we discussed today dog bites have a high risk of getting infected, and that is why we did not so your cuts.  Please do not soak or submerge your hand, as it will get infected.  Please call the hand doctor's office to set up a follow-up appointment. I would like for you to get your hand rechecked in 2 days, whether that is with the hand doctors office or back in one of the emergency rooms, your primary care doctor, or in urgent care.  If at any point you develop fevers, worsening symptoms, or have any other concerns please do not hesitate to seek additional medical care and evaluation.  Please take Ibuprofen (Advil, motrin) and Tylenol (acetaminophen) to relieve your pain.    You may take up to 600 MG (3 pills) of normal strength ibuprofen every 8 hours as needed.   You make take tylenol, up to 1,000 mg (two extra strength pills) every 8 hours as needed.   It is safe to take ibuprofen and tylenol at the same time as they work differently.   Do not take more than 3,000 mg tylenol in a 24 hour period (not more than one dose every 8 hours.  Please check all medication labels as many medications such as pain and cold medications may contain tylenol.  Do not drink alcohol while taking these medications.  Do not take other NSAID'S while taking ibuprofen (such as aleve or naproxen).  Please take ibuprofen with food to decrease stomach upset.  You may have diarrhea from the antibiotics.  It is very important that you continue to take the antibiotics even if you get diarrhea unless a medical professional tells you that you may stop taking them.  If you stop too early the bacteria you are being treated for will become stronger and you may need different, more powerful antibiotics that have more side effects and worsening diarrhea.  Please stay well hydrated and consider probiotics as they may decrease the severity of your diarrhea.   Today you received medications that may make you sleepy or impair your  ability to make decisions.  For the next 24 hours please do not drive, operate heavy machinery, care for a small child with out another adult present, or perform any activities that may cause harm to you or someone else if you were to fall asleep or be impaired.   You are being prescribed a medication which may make you sleepy. Please follow up of listed precautions for at least 24 hours after taking one dose.

## 2021-06-16 ENCOUNTER — Emergency Department (HOSPITAL_BASED_OUTPATIENT_CLINIC_OR_DEPARTMENT_OTHER)
Admission: EM | Admit: 2021-06-16 | Discharge: 2021-06-16 | Disposition: A | Discharge status: Home / Self Care | Payer: Medicare Other | Attending: Emergency Medicine | Admitting: Emergency Medicine

## 2021-06-16 ENCOUNTER — Encounter (HOSPITAL_BASED_OUTPATIENT_CLINIC_OR_DEPARTMENT_OTHER): Payer: Self-pay | Admitting: *Deleted

## 2021-06-16 ENCOUNTER — Other Ambulatory Visit: Payer: Self-pay

## 2021-06-16 DIAGNOSIS — Z79899 Other long term (current) drug therapy: Secondary | ICD-10-CM | POA: Insufficient documentation

## 2021-06-16 DIAGNOSIS — S61451D Open bite of right hand, subsequent encounter: Secondary | ICD-10-CM | POA: Insufficient documentation

## 2021-06-16 DIAGNOSIS — Z4801 Encounter for change or removal of surgical wound dressing: Secondary | ICD-10-CM | POA: Diagnosis not present

## 2021-06-16 DIAGNOSIS — W540XXA Bitten by dog, initial encounter: Secondary | ICD-10-CM

## 2021-06-16 DIAGNOSIS — Z5189 Encounter for other specified aftercare: Secondary | ICD-10-CM

## 2021-06-16 DIAGNOSIS — S6991XD Unspecified injury of right wrist, hand and finger(s), subsequent encounter: Secondary | ICD-10-CM | POA: Diagnosis present

## 2021-06-16 DIAGNOSIS — M7989 Other specified soft tissue disorders: Secondary | ICD-10-CM | POA: Diagnosis not present

## 2021-06-16 DIAGNOSIS — W540XXD Bitten by dog, subsequent encounter: Secondary | ICD-10-CM | POA: Diagnosis not present

## 2021-06-16 DIAGNOSIS — I10 Essential (primary) hypertension: Secondary | ICD-10-CM | POA: Diagnosis not present

## 2021-06-16 NOTE — Discharge Instructions (Addendum)
We suspect that your wound is stable at this time.  Unfortunately, it is hard to tell if the worst is behind [place] and you have turned the corner or not.  Therefore , return to the ER if you start having worsening swelling, pain, fevers, chills, purulent drainage from the wound site.  Otherwise, follow-up with your primary care doctor for repeat wound check in 3 days.

## 2021-06-16 NOTE — ED Provider Notes (Signed)
[facility] EMERGENCY DEPARTMENT Provider Note   CSN: [id number] Arrival date & time: 06/16/21  1403     History Chief Complaint  Patient presents with   Wound Check    Amanda Hansen is a 60 y.o. female.  HPI     60 year old female comes in with chief complaint of wound check.  Patient was seen 2 days ago with dog bite to her right hand.  She returns for recheck given the recommendation of coming in if her symptoms are getting worse.  She reports increased swelling to the hand itself, but denies any new fevers, chills.  Pain is throbbing and worse in dependent position.  No drainage from the wound site.  Patient is taking her antibiotics as prescribed.  Past Medical History:  Diagnosis Date   Arthritis    Chest pain    Hypertension    Migraine     Patient Active Problem List   Diagnosis Date Noted   Hypertension    Arthritis    Migraine    Chest pain     Past Surgical History:  Procedure Laterality Date   CESAREAN SECTION  1994   HAND TENDON SURGERY     KNEE SURGERY       OB History   No obstetric history on file.     Family History  Problem Relation Age of Onset   Hypertension Mother    Heart Problems Mother    Diabetes Father    Kidney failure Father     Social History   Tobacco Use   Smoking status: Never   Smokeless tobacco: Never  Vaping Use   Vaping Use: Never used  Substance Use Topics   Alcohol use: No   Drug use: No    Home Medications Prior to Admission medications   Medication Sig Start Date End Date Taking? Authorizing Provider  amLODipine-olmesartan (AZOR) 10-40 MG per tablet Take 1 tablet by mouth daily.    [provider]  amoxicillin-clavulanate (AUGMENTIN) 875-125 MG tablet Take 1 tablet by mouth every 12 (twelve) hours for 10 days. 06/14/21 06/24/21  Cristina Gong, PA-C  aspirin-acetaminophen-caffeine (EXCEDRIN MIGRAINE) [phone] MG per tablet Take 2 tablets by mouth every 6 (six) hours as needed  for migraine.    [provider]  diclofenac sodium (VOLTAREN) 1 % GEL Apply 2 g topically 4 (four) times daily. 06/02/17   Belinda Fisher, PA-C  HYDROcodone-acetaminophen (NORCO/VICODIN) 5-325 MG tablet Take 1 tablet by mouth every 6 (six) hours as needed for severe pain. 06/14/21   Cristina Gong, PA-C  meloxicam (MOBIC) 15 MG tablet Take 15 mg by mouth daily.    [provider]  methocarbamol (ROBAXIN) 500 MG tablet Take 500 mg by mouth 4 (four) times daily.    [provider]  omeprazole (PRILOSEC) 20 MG capsule Take 1 capsule (20 mg total) by mouth daily. 01/20/16   Law, Waylan Boga, PA-C  promethazine (PHENERGAN) 25 MG tablet Take 25 mg by mouth every 6 (six) hours as needed for nausea or vomiting.    [provider]    Allergies    Dhea [nutritional supplements], Imitrex [sumatriptan], Stadol [butorphanol], Tegretol [carbamazepine], and Topamax [topiramate]  Review of Systems   Review of Systems  Constitutional:  Positive for activity change. Negative for chills and fever.  Gastrointestinal:  Negative for nausea and vomiting.  Musculoskeletal:  Positive for myalgias.  Skin:  Positive for rash and wound.  Allergic/Immunologic: Negative for immunocompromised state.  Physical Exam Updated Vital Signs BP 129/77   Pulse 79   Temp 98.7 F (37.1 C) (Oral)   Resp 16   Ht 5\' 7"  (1.702 m)   Wt 92.5 kg   SpO2 100%   BMI 31.95 kg/m   Physical Exam Vitals and nursing note reviewed.  Constitutional:      Appearance: She is well-developed.  HENT:     Head: Atraumatic.  Cardiovascular:     Rate and Rhythm: Normal rate.  Pulmonary:     Effort: Pulmonary effort is normal.  Musculoskeletal:        General: Swelling and tenderness present. No deformity.     Cervical back: Normal range of motion and neck supple.     Comments: Patient has tenderness over the right hand, mostly over the dorsum.  Passive range of motion of the digits and over the wrist  is normal.  Patient is able to make a fist without significant discomfort until the end of the ranging.  There is tenderness to palpation over the dorsum without any fluctuance.  No drainage from the wound site.  Slightly warm to touch without significant erythema  Skin:    General: Skin is warm and dry.  Neurological:     Mental Status: She is alert and oriented to person, place, and time.           ED Results / Procedures / Treatments   Labs (all labs ordered are listed, but only abnormal results are displayed) Labs Reviewed - No data to display  EKG None  Radiology DG Hand Complete Right  Result Date: 06/14/2021 CLINICAL DATA:  Dog bite EXAM: RIGHT HAND - COMPLETE 3+ VIEW COMPARISON:  None. FINDINGS: There is no evidence of fracture or dislocation. There is no evidence of arthropathy or other focal bone abnormality. Subcutaneus soft tissue edema and laceration with no retained radiopaque foreign body. IMPRESSION: 1. No acute displaced fracture or dislocation. 2. No retained radiopaque foreign body. Electronically Signed   By: 06/16/2021 M.D.   On: 06/14/2021 20:37    Procedures Procedures   Medications Ordered in ED Medications - No data to display  ED Course  I have reviewed the triage vital signs and the nursing notes.  Pertinent labs & imaging results that were available during my care of the patient were reviewed by me and considered in my medical decision making (see chart for details).    MDM Rules/Calculators/A&P                            60 year old comes in with chief complaint of wound evaluation. 2 days after the bite, she has increased swelling, but clinically does not appear to be worsening from infection perspective.  Infection is mostly localized to the dorsum of the hand, and is reactive to the localized trauma and unlikely to be infectious.  We have advised her to return to the ER if her symptoms progress or she starts developing new systemic  symptoms.  Otherwise outpatient PCP follow-up is appropriate.  Patient is comfortable with the plan.  Final Clinical Impression(s) / ED Diagnoses Final diagnoses:  Visit for wound check  Dog bite, initial encounter    Rx / DC Orders ED Discharge Orders     None        67, MD 06/16/21 1612

## 2021-06-16 NOTE — ED Triage Notes (Signed)
Seen here Monday for dog bite to right hand, return today for increased swelling to hand and fingers

## 2022-03-18 IMAGING — DX DG HAND COMPLETE 3+V*R*
3 series · 3 of 3 positions shown · non-contrast
Comparison: None.

CLINICAL DATA: Dog bite

EXAM:
RIGHT HAND - COMPLETE 3+ VIEW

[hand pa]
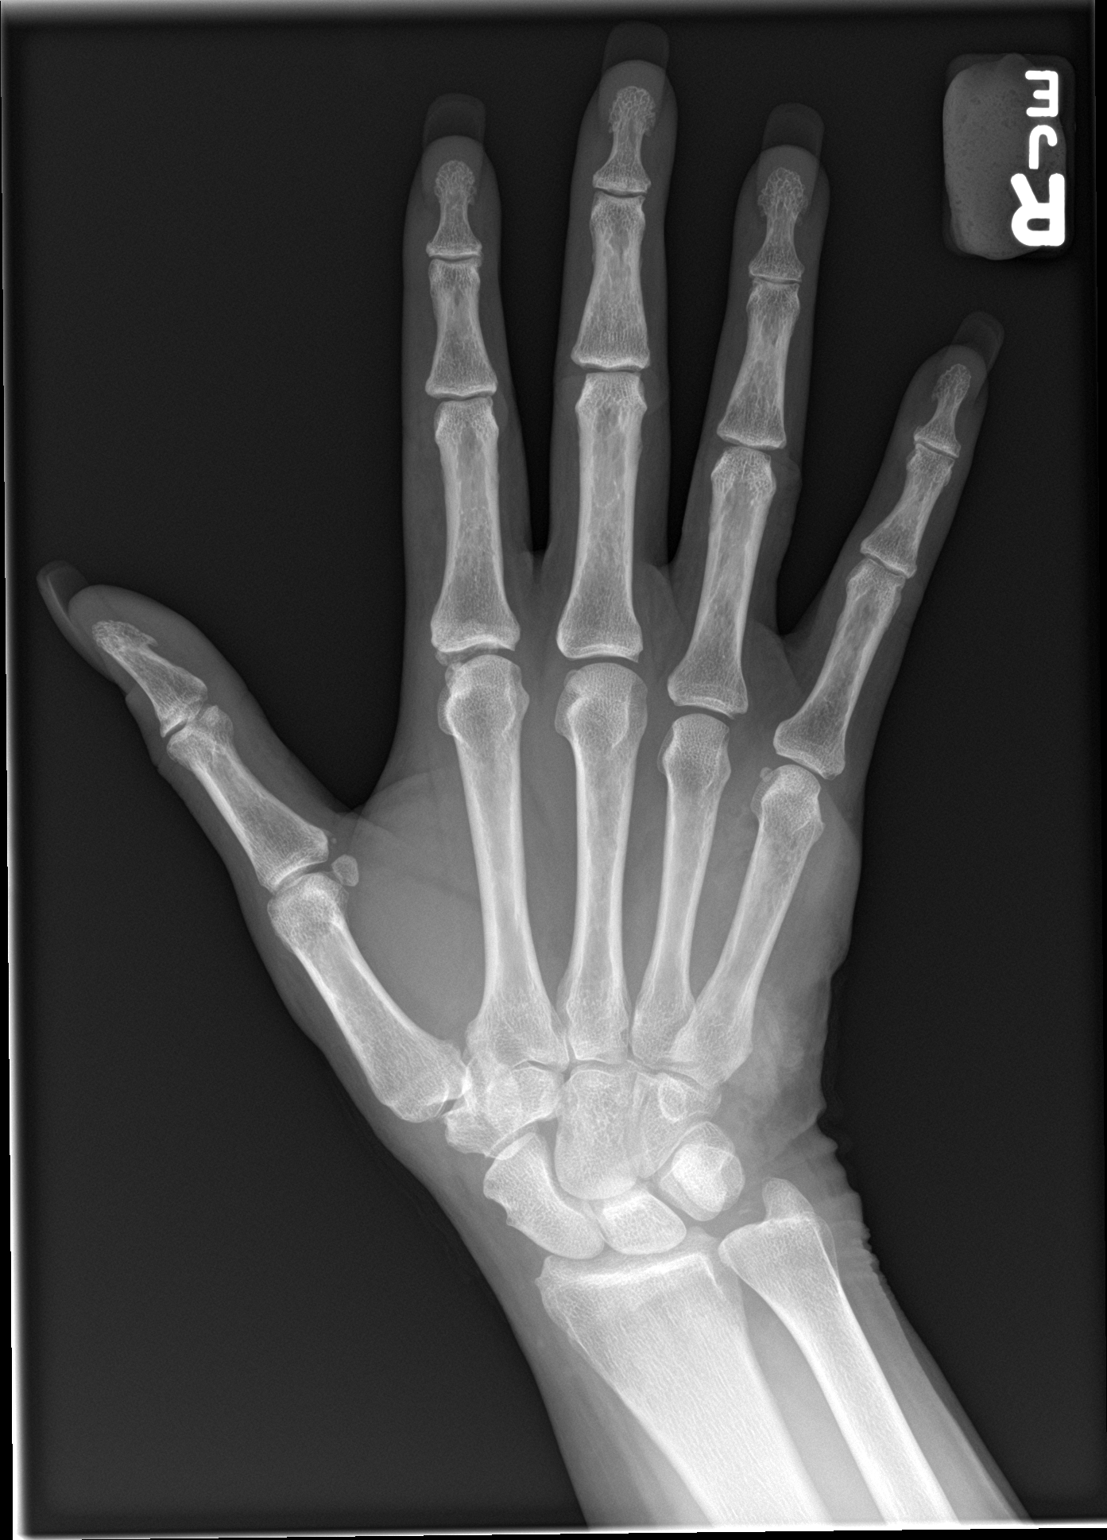

[hand obl]
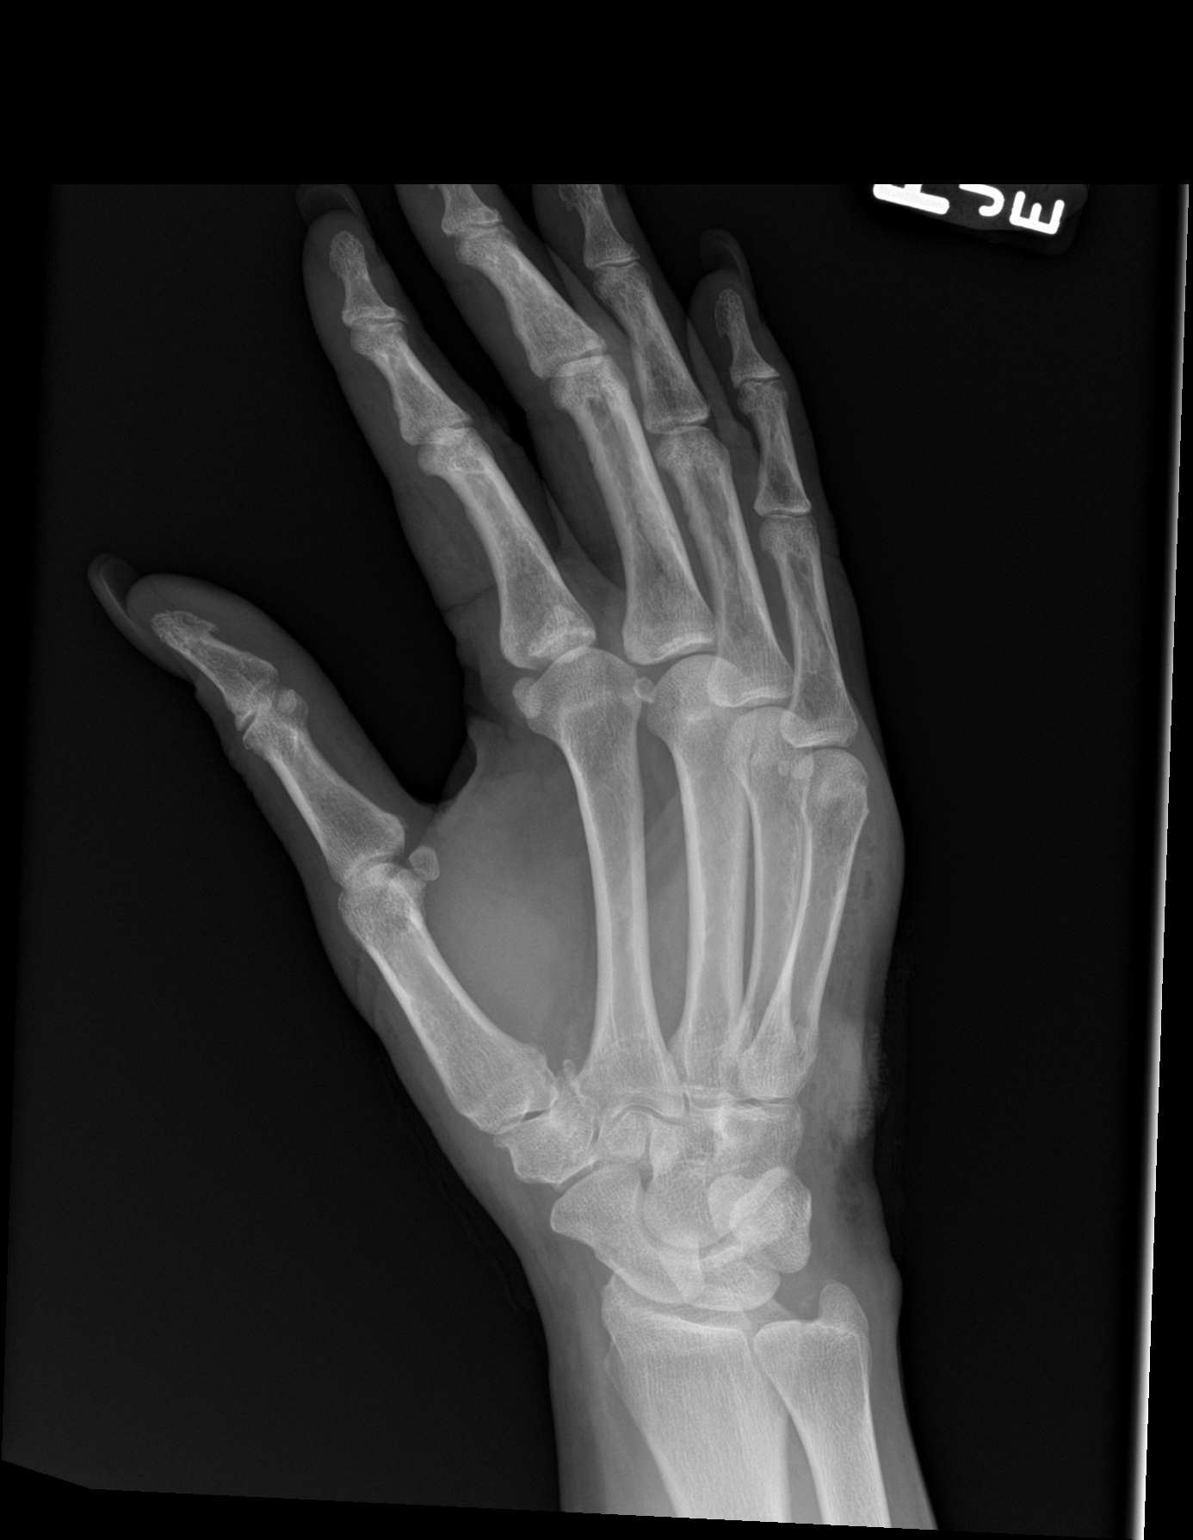

[hand lat]
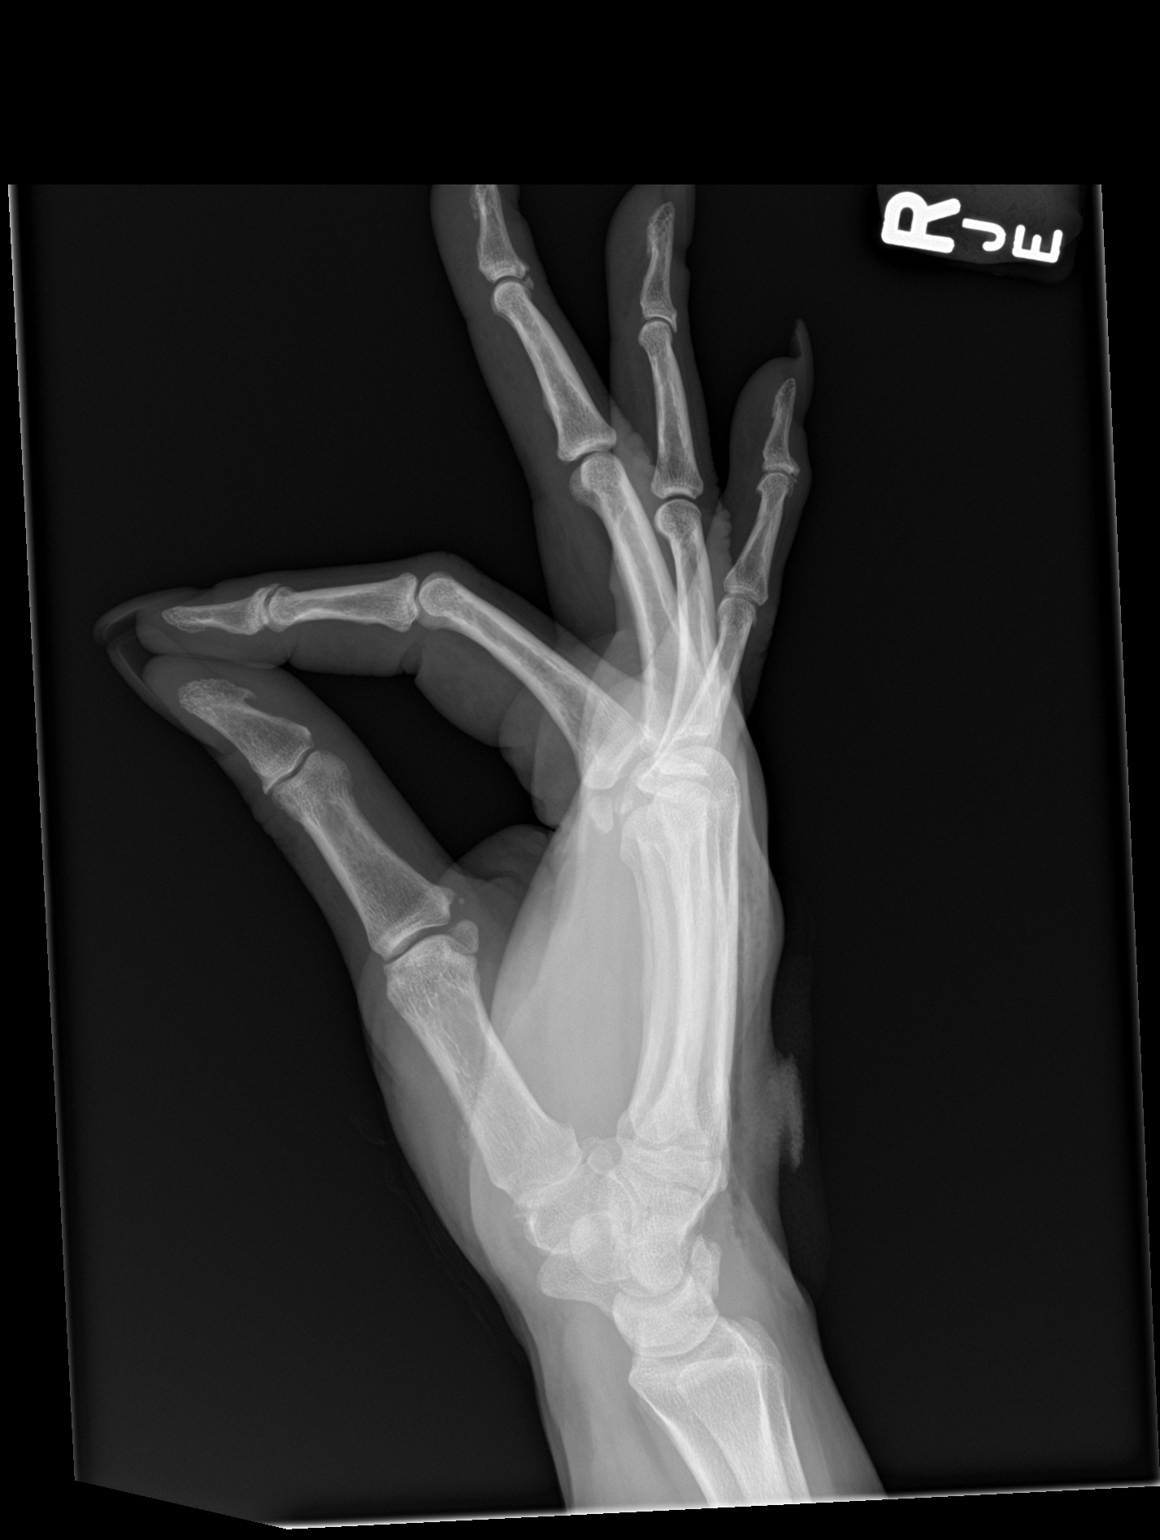

[3 of 3 positions shown; findings below may reference images not displayed]

FINDINGS: There is no evidence of fracture or dislocation. There is no
evidence of arthropathy or other focal bone abnormality. Subcutaneus
soft tissue edema and laceration with no retained radiopaque foreign
body.
IMPRESSION: 1. No acute displaced fracture or dislocation.
2. No retained radiopaque foreign body.

## 2024-06-16 ENCOUNTER — Emergency Department (HOSPITAL_BASED_OUTPATIENT_CLINIC_OR_DEPARTMENT_OTHER)

## 2024-06-16 ENCOUNTER — Emergency Department (HOSPITAL_BASED_OUTPATIENT_CLINIC_OR_DEPARTMENT_OTHER)
Admission: EM | Admit: 2024-06-16 | Discharge: 2024-06-16 | Disposition: A | Discharge status: Home / Self Care | Attending: Emergency Medicine | Admitting: Emergency Medicine

## 2024-06-16 ENCOUNTER — Encounter (HOSPITAL_BASED_OUTPATIENT_CLINIC_OR_DEPARTMENT_OTHER): Payer: Self-pay

## 2024-06-16 ENCOUNTER — Other Ambulatory Visit: Payer: Self-pay

## 2024-06-16 DIAGNOSIS — R0602 Shortness of breath: Secondary | ICD-10-CM | POA: Insufficient documentation

## 2024-06-16 DIAGNOSIS — T43205A Adverse effect of unspecified antidepressants, initial encounter: Secondary | ICD-10-CM | POA: Insufficient documentation

## 2024-06-16 LAB — CBC
HCT: 38.3 % (ref 36.0–46.0)
Hemoglobin: 12.3 g/dL (ref 12.0–15.0)
MCH: 27 pg (ref 26.0–34.0)
MCHC: 32.1 g/dL (ref 30.0–36.0)
MCV: 84 fL (ref 80.0–100.0)
Platelets: 333 K/uL (ref 150–400)
RBC: 4.56 MIL/uL (ref 3.87–5.11)
RDW: 14.7 % (ref 11.5–15.5)
WBC: 9.7 K/uL (ref 4.0–10.5)
nRBC: 0 % (ref 0.0–0.2)

## 2024-06-16 LAB — BASIC METABOLIC PANEL WITH GFR
Anion gap: 12 (ref 5–15)
BUN: 13 mg/dL (ref 8–23)
CO2: 25 mmol/L (ref 22–32)
Calcium: 9.8 mg/dL (ref 8.9–10.3)
Chloride: 100 mmol/L (ref 98–111)
Creatinine, Ser: 0.88 mg/dL (ref 0.44–1.00)
GFR, Estimated: >60 mL/min (ref >60)
Glucose, Bld: 113 mg/dL — ABNORMAL HIGH (ref 70–99)
Potassium: 4 mmol/L (ref 3.5–5.1)
Sodium: 137 mmol/L (ref 135–145)

## 2024-06-16 LAB — TROPONIN T, HIGH SENSITIVITY: Troponin T High Sensitivity: <15 ng/L (ref 0–19)

## 2024-06-16 MED ORDER — VENLAFAXINE HCL 37.5 MG PO TABS: 37.5000 mg | ORAL_TABLET | Freq: Every day | ORAL | 0 refills | Status: AC

## 2024-06-16 NOTE — Discharge Instructions (Signed)
 Please read and follow all provided instructions.  Your diagnoses today include:  1. Chronic antidepressant withdrawal   2. Shortness of breath     Tests performed today include: An EKG of your heart A chest x-ray Cardiac enzymes - a blood test for heart muscle damage Blood counts and electrolytes Vital signs. See below for your results today.   Medications prescribed:  Venlafaxine   Please take this medication as prescribed for a week, and then try to take half of a tablet a day for 2 weeks.  If you do well after that, take a quarter of a tablet for 2 weeks and then stop.  Take any prescribed medications only as directed.  Follow-up instructions: Please talk with your doctor for advice regarding an appropriate tapering regimen.  Return instructions:  SEEK IMMEDIATE MEDICAL ATTENTION IF: You have severe chest pain, especially if the pain is crushing or pressure-like and spreads to the arms, back, neck, or jaw, or if you have sweating, nausea or vomiting, or trouble with breathing. THIS IS AN EMERGENCY. Do not wait to see if the pain will go away. Get medical help at once. Call 911. DO NOT drive yourself to the hospital.  Your chest pain gets worse and does not go away after a few minutes of rest.  You have an attack of chest pain lasting longer than what you usually experience.  You have significant dizziness, if you pass out, or have trouble walking.  You have chest pain not typical of your usual pain for which you originally saw your caregiver.  You have any other emergent concerns regarding your health.  Your vital signs today were: BP (!) 142/87   Pulse 92   Temp 97.6 F (36.4 C) (Oral)   Resp 16   Wt 84.8 kg   SpO2 99%   BMI 29.29 kg/m  If your blood pressure (BP) was elevated above 135/85 this visit, please have this repeated by your doctor within one month. --------------

## 2024-06-16 NOTE — ED Provider Notes (Signed)
 [place] [facility] AT [facility] Provider Note   CSN: [id number] Arrival date & time: 06/16/24  1426     Patient presents with: Shortness of Breath and Dizziness   Amanda Hansen is a 63 y.o. female.   Patient with tree of hypertension --presents to the emergency department for evaluation of nausea, diarrhea, shortness of breath, tinnitus, visual changes.  Patient reports being on venlafaxine  for a long period of time.  She is followed at the [facility].  In around May, she talk to her provider about coming off of the venlafaxine .  She states that her provider prescribed a decreased dose, 37.5 mg, and instructed her to taper off of it.  Patient states that she did not taper down, but completed the supply of medicine.  She has been off for about 3 days.  Initially she felt okay but about 2 days after discontinuation she developed symptoms.  She denies chest pain.  The dizzy sensation that she is having is not a true lightheadedness or vertigo.  She states that she just feels bad.  She has not been vomiting.  No abdominal pain.  No urinary symptoms.  No history of ACS.       Prior to Admission medications   Medication Sig Start Date End Date Taking? Authorizing Provider  amLODipine-olmesartan (AZOR) 10-40 MG per tablet Take 1 tablet by mouth daily.    [provider]  aspirin -acetaminophen -caffeine (EXCEDRIN MIGRAINE) 250-250-65 MG per tablet Take 2 tablets by mouth every 6 (six) hours as needed for migraine.    [provider]  diclofenac  sodium (VOLTAREN ) 1 % GEL Apply 2 g topically 4 (four) times daily. 06/02/17   Babara, Amy V, PA-C  HYDROcodone -acetaminophen  (NORCO/VICODIN) 5-325 MG tablet Take 1 tablet by mouth every 6 (six) hours as needed for severe pain. 06/14/21   Windle Almarie ORN, PA-C  meloxicam (MOBIC) 15 MG tablet Take 15 mg by mouth daily.    [provider]  methocarbamol (ROBAXIN) 500 MG tablet Take 500 mg by mouth 4 (four) times  daily.    [provider]  omeprazole  (PRILOSEC) 20 MG capsule Take 1 capsule (20 mg total) by mouth daily. 01/20/16   Law, Alexandra M, PA-C  promethazine  (PHENERGAN ) 25 MG tablet Take 25 mg by mouth every 6 (six) hours as needed for nausea or vomiting.    [provider]    Allergies: Dhea [nutritional supplements], Imitrex [sumatriptan], Stadol [butorphanol], Tegretol [carbamazepine], and Topamax [topiramate]    Review of Systems  Updated Vital Signs BP (!) 142/87   Pulse 92   Temp 97.6 F (36.4 C) (Oral)   Resp 16   Wt 84.8 kg   SpO2 99%   BMI 29.29 kg/m   Physical Exam Vitals and nursing note reviewed.  Constitutional:      General: She is not in acute distress.    Appearance: She is well-developed.  HENT:     Head: Normocephalic and atraumatic.     Right Ear: Tympanic membrane, ear canal and external ear normal.     Left Ear: Tympanic membrane, ear canal and external ear normal.     Nose: Nose normal.     Mouth/Throat:     Pharynx: Uvula midline.  Eyes:     General: Lids are normal.     Extraocular Movements:     Right eye: No nystagmus.     Left eye: No nystagmus.     Conjunctiva/sclera: Conjunctivae normal.     Pupils:  Pupils are equal, round, and reactive to light.  Cardiovascular:     Rate and Rhythm: Normal rate and regular rhythm.     Heart sounds: No murmur heard. Pulmonary:     Effort: Pulmonary effort is normal. No respiratory distress.     Breath sounds: Normal breath sounds. No wheezing, rhonchi or rales.  Abdominal:     Palpations: Abdomen is soft.     Tenderness: There is no abdominal tenderness. There is no guarding or rebound.  Musculoskeletal:     Cervical back: Normal range of motion and neck supple. No tenderness or bony tenderness.     Right lower leg: No edema.     Left lower leg: No edema.  Skin:    General: Skin is warm and dry.     Findings: No rash.  Neurological:     General: No focal deficit present.     Mental  Status: She is alert and oriented to person, place, and time. Mental status is at baseline.     GCS: GCS eye subscore is 4. GCS verbal subscore is 5. GCS motor subscore is 6.     Cranial Nerves: No cranial nerve deficit.     Sensory: No sensory deficit.     Motor: No weakness.     Coordination: Coordination normal.     Comments: Upper extremity myotomes tested bilaterally:  C5 Shoulder abduction 5/5 C6 Elbow flexion/wrist extension 5/5 C7 Elbow extension 5/5 C8 Finger flexion 5/5 T1 Finger abduction 5/5  Lower extremity myotomes tested bilaterally: L2 Hip flexion 5/5 L3 Knee extension 5/5 L4 Ankle dorsiflexion 5/5 S1 Ankle plantar flexion 5/5   Psychiatric:        Mood and Affect: Mood normal.     (all labs ordered are listed, but only abnormal results are displayed) Labs Reviewed  CBC  BASIC METABOLIC PANEL WITH GFR  TROPONIN T, HIGH SENSITIVITY    ED ECG REPORT   Date: 06/16/2024  Rate: 87  Rhythm: normal sinus rhythm  QRS Axis: normal  Intervals: normal  ST/T Wave abnormalities: normal  Conduction Disutrbances:none  Narrative Interpretation:   Old EKG Reviewed: unchanged  I have personally reviewed the EKG tracing and agree with the computerized printout as noted.   Radiology: No results found.   Procedures   Medications Ordered in the ED - No data to display  ED Course  Patient seen and examined. History obtained directly from patient.   Labs/EKG: Ordered CBC, BMP, troponin x 1.  Imaging: Ordered chest x-ray  Medications/Fluids: None ordered  Most recent vital signs reviewed and are as follows: BP (!) 142/87   Pulse 92   Temp 97.6 F (36.4 C) (Oral)   Resp 16   Wt 84.8 kg   SpO2 99%   BMI 29.29 kg/m   Initial impression: Patient's symptoms and history are highly suggestive of antidepressant withdrawal, however given risk factors and associated shortness of breath, will check basic labs and chest x-ray.  EKG personally reviewed and  interpreted as above.  3:36 PM Reassessment performed. Patient appears stable, comfortable.  Labs personally reviewed and interpreted including: CBC unremarkable; BMP glucose 113 otherwise unremarkable; troponin less than 15.  Imaging personally visualized and interpreted including: Chest x-ray, agree negative.  Reviewed pertinent lab work and imaging with patient at bedside. Questions answered.   Most current vital signs reviewed and are as follows: BP (!) 142/87   Pulse 92   Temp 97.6 F (36.4 C) (Oral)   Resp 16  Wt 84.8 kg   SpO2 99%   BMI 29.29 kg/m   Plan: Discharge to home.   Prescriptions written for: Venlafaxine  37.5 mg tablets.    Other home care instructions discussed: I asked the patient to talk with her physician in regards to an appropriate tapering regimen, however she may start by taking full dose for 1 week to ensure that withdrawal symptoms resolve.  After that, start taking half of a tablet daily for 2 weeks.  If no withdrawal symptoms, may take quarter of a tablet daily for 2 weeks.  ED return instructions discussed: New or worsening symptoms  Follow-up instructions discussed: Patient encouraged to follow-up with their PCP in 7 days.                                   Medical Decision Making Amount and/or Complexity of Data Reviewed Labs: ordered. Radiology: ordered.  Risk Prescription drug management.   Patient with several nonspecific symptoms including nausea, dizziness, diarrhea starting 2 days after stopping venlafaxine  which she has been on for a long period of time.  Cardiac workup is very reassuring.  Symptoms are atypical for ACS, PE.  Chest x-ray is clear.  Given associated neurosymptoms, all of which are mild, feel that this is most likely medication withdrawal.  Patient will need to taper off of this medication more slowly.  We discussed strategies as above.  Ultimately I recommended that she follow-up with her primary care doctor who has  more experience with discontinuing this medication.  The patient's vital signs, pertinent lab work and imaging were reviewed and interpreted as discussed in the ED course. Hospitalization was considered for further testing, treatments, or serial exams/observation. However as patient is well-appearing, has a stable exam, and reassuring studies today, I do not feel that they warrant admission at this time. This plan was discussed with the patient who verbalizes agreement and comfort with this plan and seems reliable and able to return to the Emergency Department with worsening or changing symptoms.       Final diagnoses:  Chronic antidepressant withdrawal  Shortness of breath    ED Discharge Orders          Ordered    venlafaxine  (EFFEXOR ) 37.5 MG tablet  Daily        06/16/24 1534               Desiderio Chew, PA-C 06/16/24 1538    Emil Share, DO 06/16/24 1737

## 2024-06-16 NOTE — ED Triage Notes (Signed)
 Reports SHOB, nausea, ringing ears, dizziness for 3 days. No obvious signs of respiratory distress upon assessment   States is currently tapering down antidepressant medication  Denies chest pain, emesis, fevers

## 2024-06-16 NOTE — ED Notes (Signed)
 ED Provider at bedside.
